# Patient Record
Sex: Male | Born: 1950 | Race: White | Hispanic: No | Marital: Married | Smoking: Never smoker
Health system: Southern US, Community
[De-identification: ages and names within clinical notes are randomized; demographics above are authoritative.]

## PROBLEM LIST (undated history)

## (undated) DIAGNOSIS — M1990 Unspecified osteoarthritis, unspecified site: Secondary | ICD-10-CM

## (undated) DIAGNOSIS — M75100 Unspecified rotator cuff tear or rupture of unspecified shoulder, not specified as traumatic: Secondary | ICD-10-CM

## (undated) DIAGNOSIS — C4490 Unspecified malignant neoplasm of skin, unspecified: Secondary | ICD-10-CM

## (undated) DIAGNOSIS — M6530 Trigger finger, unspecified finger: Secondary | ICD-10-CM

## (undated) DIAGNOSIS — E119 Type 2 diabetes mellitus without complications: Secondary | ICD-10-CM

## (undated) DIAGNOSIS — E785 Hyperlipidemia, unspecified: Secondary | ICD-10-CM

## (undated) DIAGNOSIS — N183 Chronic kidney disease, stage 3 unspecified: Secondary | ICD-10-CM

## (undated) DIAGNOSIS — R011 Cardiac murmur, unspecified: Secondary | ICD-10-CM

## (undated) DIAGNOSIS — E291 Testicular hypofunction: Secondary | ICD-10-CM

## (undated) DIAGNOSIS — D75839 Thrombocytosis, unspecified: Secondary | ICD-10-CM

## (undated) DIAGNOSIS — K2281 Esophageal polyp: Secondary | ICD-10-CM

## (undated) DIAGNOSIS — D582 Other hemoglobinopathies: Secondary | ICD-10-CM

## (undated) DIAGNOSIS — N400 Enlarged prostate without lower urinary tract symptoms: Secondary | ICD-10-CM

## (undated) DIAGNOSIS — G5600 Carpal tunnel syndrome, unspecified upper limb: Secondary | ICD-10-CM

## (undated) DIAGNOSIS — K219 Gastro-esophageal reflux disease without esophagitis: Secondary | ICD-10-CM

## (undated) DIAGNOSIS — I10 Essential (primary) hypertension: Secondary | ICD-10-CM

## (undated) DIAGNOSIS — E1121 Type 2 diabetes mellitus with diabetic nephropathy: Secondary | ICD-10-CM

## (undated) DIAGNOSIS — E1142 Type 2 diabetes mellitus with diabetic polyneuropathy: Secondary | ICD-10-CM

## (undated) DIAGNOSIS — E1165 Type 2 diabetes mellitus with hyperglycemia: Secondary | ICD-10-CM

## (undated) DIAGNOSIS — H35039 Hypertensive retinopathy, unspecified eye: Secondary | ICD-10-CM

## (undated) HISTORY — DX: Benign prostatic hyperplasia without lower urinary tract symptoms: N40.0

## (undated) HISTORY — PX: CARPAL BOSS EXCISION: SHX1302

## (undated) HISTORY — DX: Testicular hypofunction: E29.1

## (undated) HISTORY — DX: Thrombocytosis, unspecified: D75.839

## (undated) HISTORY — DX: Type 2 diabetes mellitus with hyperglycemia: E11.65

## (undated) HISTORY — DX: Unspecified rotator cuff tear or rupture of unspecified shoulder, not specified as traumatic: M75.100

## (undated) HISTORY — PX: CATARACT EXTRACTION W/ INTRAOCULAR LENS IMPLANT: SHX1309

## (undated) HISTORY — DX: Other hemoglobinopathies: D58.2

## (undated) HISTORY — DX: Carpal tunnel syndrome, unspecified upper limb: G56.00

## (undated) HISTORY — PX: CARPAL TUNNEL RELEASE: SHX101

## (undated) HISTORY — DX: Type 2 diabetes mellitus with diabetic nephropathy: E11.21

## (undated) HISTORY — DX: Chronic kidney disease, stage 3 unspecified: N18.30

## (undated) HISTORY — DX: Hypertensive retinopathy, unspecified eye: H35.039

## (undated) HISTORY — DX: Hyperlipidemia, unspecified: E78.5

## (undated) HISTORY — PX: KNEE SURGERY: SHX244

## (undated) HISTORY — DX: Esophageal polyp: K22.81

## (undated) HISTORY — DX: Cardiac murmur, unspecified: R01.1

## (undated) HISTORY — DX: Trigger finger, unspecified finger: M65.30

## (undated) HISTORY — DX: Type 2 diabetes mellitus with diabetic polyneuropathy: E11.42

## (undated) HISTORY — PX: ROTATOR CUFF REPAIR: SHX139

## (undated) HISTORY — PX: PARATHYROIDECTOMY: SHX19

---

## 1999-09-22 ENCOUNTER — Encounter: Admission: RE | Admit: 1999-09-22 | Discharge: 1999-12-21 | Payer: Self-pay | Admitting: *Deleted

## 2007-03-09 ENCOUNTER — Encounter: Admission: RE | Admit: 2007-03-09 | Discharge: 2007-03-09 | Payer: Self-pay | Admitting: Endocrinology

## 2007-04-03 ENCOUNTER — Encounter: Admission: RE | Admit: 2007-04-03 | Discharge: 2007-04-03 | Payer: Self-pay | Admitting: General Surgery

## 2007-04-21 ENCOUNTER — Encounter: Admission: RE | Admit: 2007-04-21 | Discharge: 2007-04-21 | Payer: Self-pay | Admitting: General Surgery

## 2007-05-26 ENCOUNTER — Ambulatory Visit (HOSPITAL_COMMUNITY): Admission: RE | Admit: 2007-05-26 | Discharge: 2007-05-27 | Payer: Self-pay | Admitting: General Surgery

## 2007-05-26 ENCOUNTER — Encounter (HOSPITAL_BASED_OUTPATIENT_CLINIC_OR_DEPARTMENT_OTHER): Payer: Self-pay | Admitting: General Surgery

## 2008-09-27 IMAGING — US US SOFT TISSUE HEAD/NECK
1 series · 13 of 25 positions shown · non-contrast
Comparison: [HOSPITAL] neck MRI 04/03/07.

CLINICAL DATA: Primary hyperparathyroidism.  Elevated calcium.  Question parathyroid tumor with previous GI 04/03/07 enhancing lesion posterior right thyroid gland which may represent parathyroid adenoma.  For further assessment. 
THYROID ULTRASOUND:
TECHNIQUE: Ultrasound examination of the thyroid gland and adjacent soft tissue structures was performed.

[Series 1: us soft tissue head/neck · 0.07mm/px · 13 of 51 slices shown]
[im 1/51]
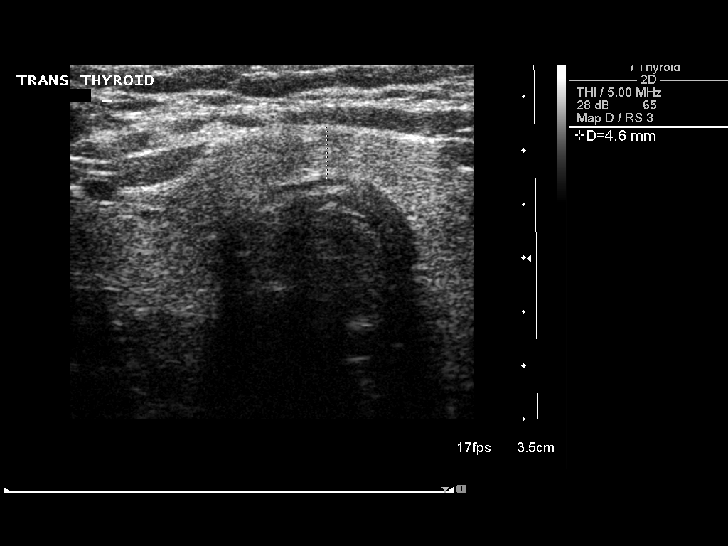
[im 5/51]
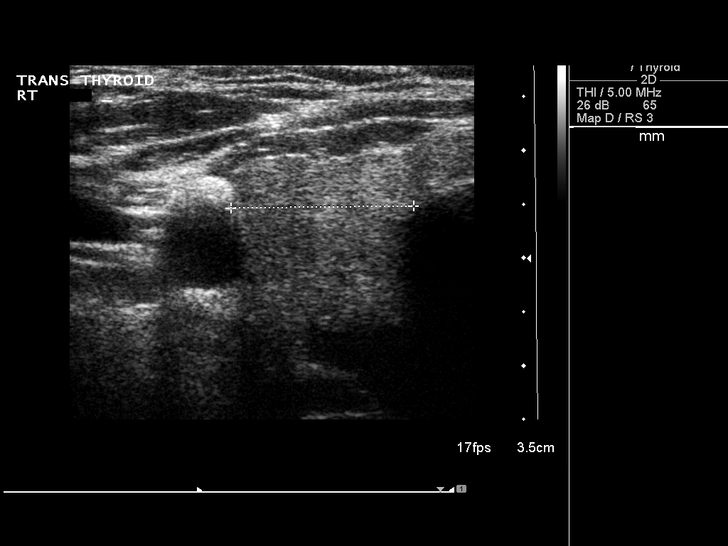
[im 9/51]
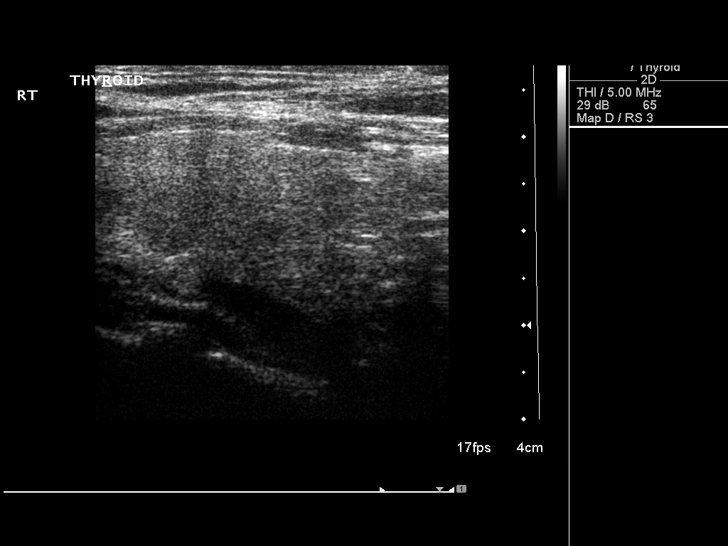
[im 13/51]
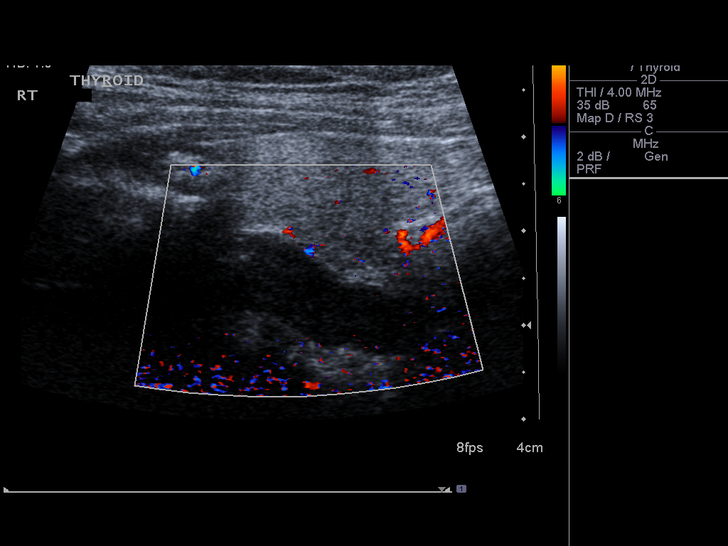
[im 17/51]
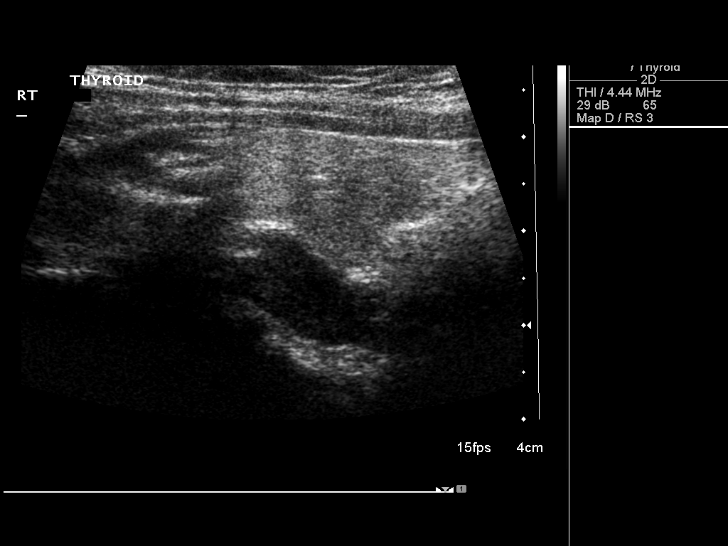
[im 21/51]
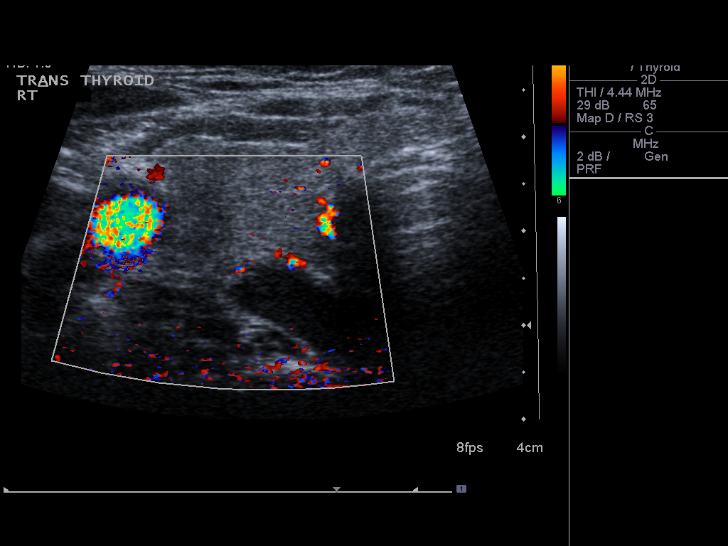
[im 26/51]
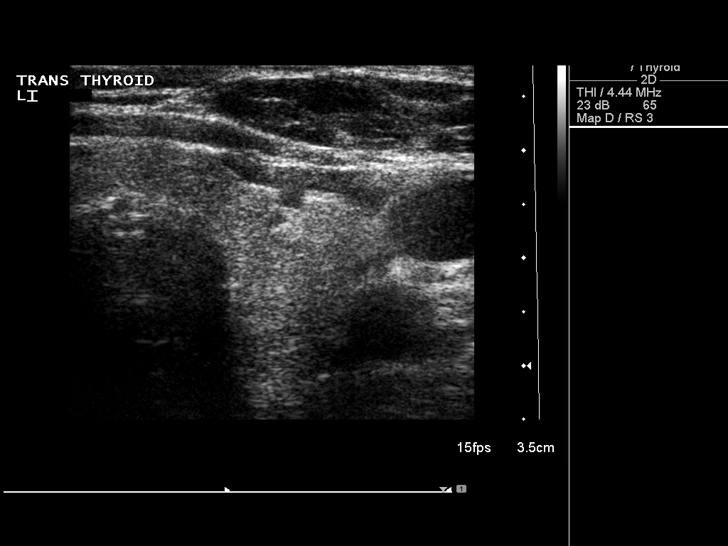
[im 30/51]
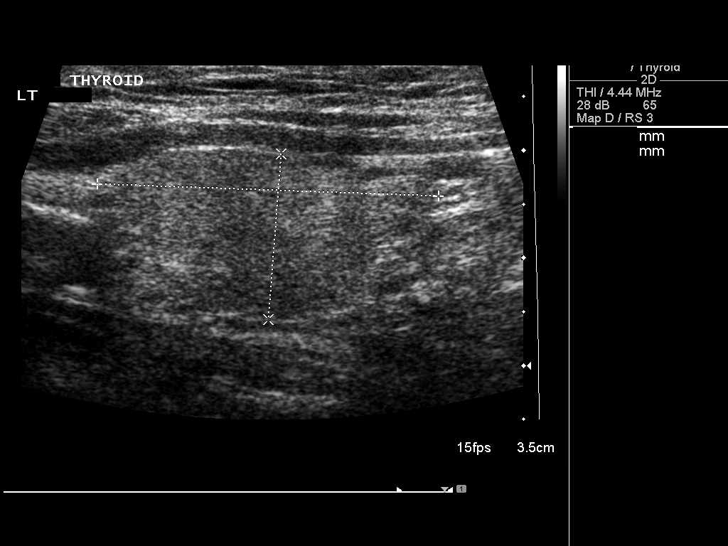
[im 34/51]
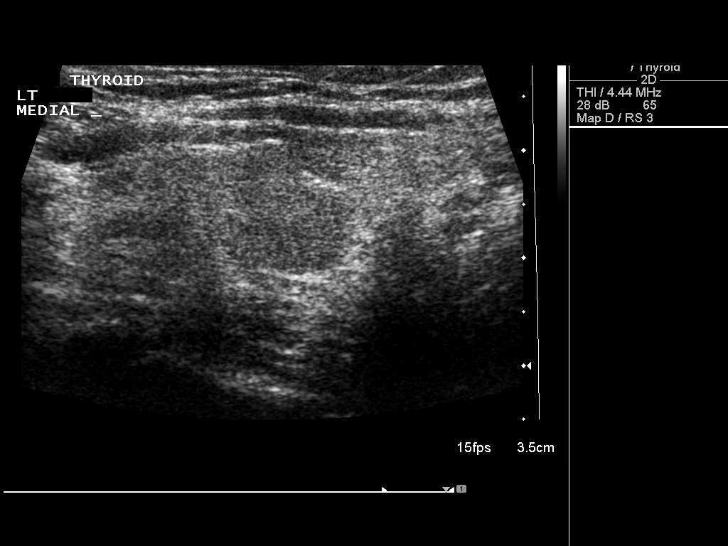
[im 38/51]
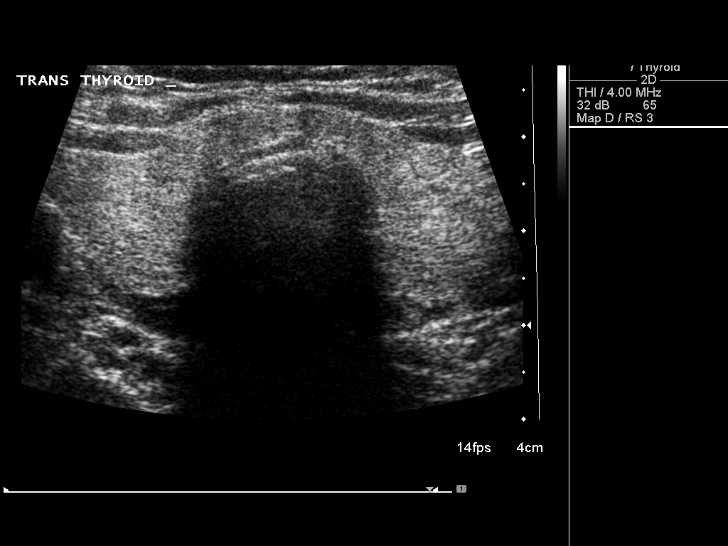
[im 42/51]
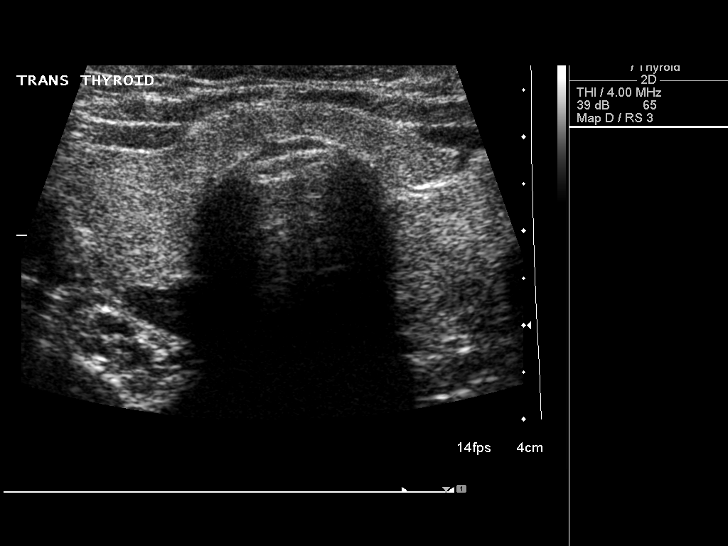
[im 46/51]
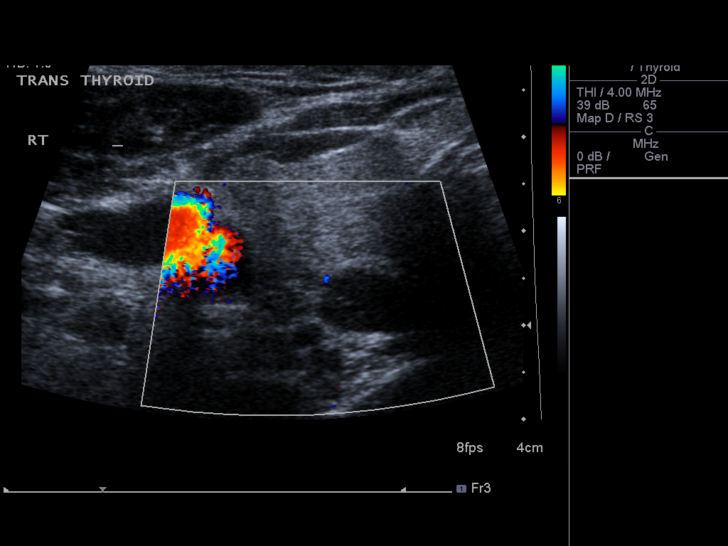
[im 51/51]
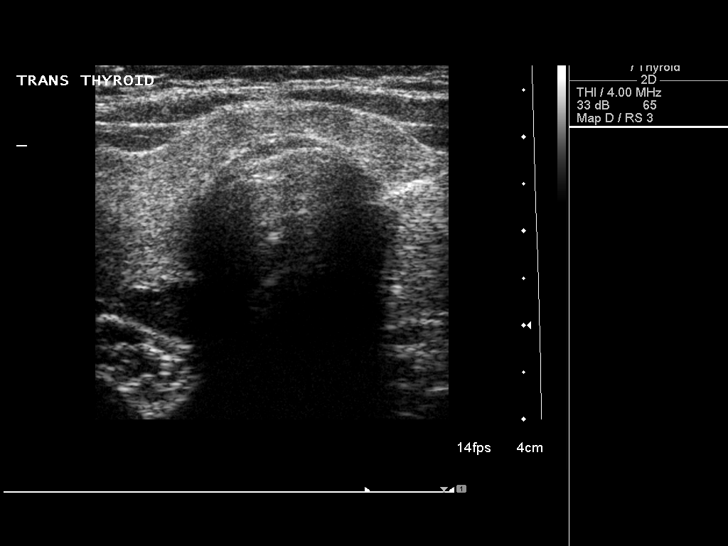

[13 of 25 positions shown; findings below may reference images not displayed]

FINDINGS: In region of MRI is marginated ovoid hypoechoic nodule at the posterior right lobe thyroid midportion.  This measures 2.3 cm long x 0.8 cm AP x 1.5 cm wide.  In light of clinical findings this likely represents parathyroid adenoma although nonspecific characteristics sonographically.  No other parathyroid gland nodule is identified by ultrasound.  The thyroid gland appears normal with the right lobe measuring 3.8 cm long x 1.8 cm AP x 1.7 cm wide and left lobe 3.5 cm long x 1.6 cm AP x 1.5 cm wide.  The isthmus measures upper limits of normal at 5 mm.  Thyroid echotexture is homogeneous.
IMPRESSION: 1.  Ultrasound confirms hypoechoic nodule posterior to midright lobe thyroid ? probable parathyroid adenoma in light of clinical history.  No other parathyroid nodules delineated by ultrasound.  
2.  Normal appearing thyroid. 
3.  No sonographic evidence for cervical adenopathy.

## 2010-08-05 NOTE — Op Note (Signed)
[name], [name]              ACCOUNT NO.:  [id number]   MEDICAL RECORD NO.:  [id number]          PATIENT TYPE:  OIB   LOCATION:  2550                         FACILITY:  [facility]   PHYSICIAN:  Leonie Man, M.D.   DATE OF BIRTH:  01-Jul-1950   DATE OF PROCEDURE:  05/26/2007  DATE OF DISCHARGE:                               OPERATIVE REPORT   PREOPERATIVE DIAGNOSIS:  Primary hyperparathyroidism.   POSTOPERATIVE DIAGNOSIS:  Primary hyperparathyroidism.   PROCEDURE:  Minimally invasive resection parathyroid with excision of  right inferior parathyroid gland.   SURGEON:  Leonie Man, M.D.   ASSISTANT:  Velora Heckler, M.D.   ANESTHESIA:  General.   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   ESTIMATED BLOOD LOSS:  Minimal.   COMPLICATIONS:  None.   INDICATIONS FOR PROCEDURE:  Man presenting with hypercalcemia.  He  underwent sestamibi scan which was nondiagnostic.  He underwent MRI of  the neck and ultrasound of the neck both of which identify a mass  posterior to the right lobe of the thyroid that may be consistent with a  parathyroid adenoma.  The patient had a moderately elevated PTH level of  83 picograms per mL and a 24-hour urinary calcium of 554.  The patient  comes to the operating room now after the risks and potential benefits  of surgery have been fully discussed.  All questions answered and  consent obtained.   DESCRIPTION OF PROCEDURE:  Following induction of satisfactory general  anesthesia, the neck is prepped and draped to be included in a sterile  operative field.  Positive identification of the patient, Ronald [name]  and the procedure as R minimally invasive parathyroid resection was  carried out.  We then made a transverse incision extending from the  right sternocleidomastoid anterior border to the midline of the trachea,  deepening this through skin and subcutaneous tissue raising a  myocutaneous flap inferiorly to the  clavicle and another up to the  thyroid cartilage.  The midline of the strap muscles are opened and  retracted to the right laterally.  The thyroid was dissected.  The  middle thyroid vein was taken between clips and the thyroid was  reflected medially.  Behind the right thyroid lobe a large parathyroid  adenoma was noted to be closely adhered to the posterior wall of the  thyroid.  This was dissected off carefully maintaining hemostasis  throughout the course of dissection and dissecting the parathyroid  completely off of the posterior wall of the thyroid.  This was removed  in its entirety and forwarded for pathologic evaluation.  Frozen section  diagnosis confirmed this as parathyroid tissue consistent with adenoma.  All areas of the dissection then checked for hemostasis.  Additional  bleeding points were treated electrocautery.  Sponge and instrument  counts were doubly verified.  I used Surgicel pledgets to pack into the  region of dissection for additional hemostasis.  The midline strap  muscles were then closed with interrupted 3-0 Vicryl sutures.  The  platysma muscle was also reapproximated with interrupted 3-0 Vicryl  sutures.  Skin closed  with 5-0 Monocryl and reinforced with Dermabond  and Steri-Strips.  The patient is then removed from the operating room  to the recovery room in stable condition.  He tolerated the procedure  well.      Leonie Man, M.D.  Electronically Signed     PB/MEDQ  D:  05/26/2007  T:  05/26/2007  Job:  [id number]   cc:   Leonie Man, M.D.  Dorisann Frames, M.D.  Robert A. Nicholos Johns, M.D.

## 2010-12-15 LAB — COMPREHENSIVE METABOLIC PANEL
AST: 31
Albumin: 4.2
Alkaline Phosphatase: 64
BUN: 11
GFR calc non Af Amer: >60
Glucose, Bld: 187 — ABNORMAL HIGH
Total Bilirubin: 1
Total Protein: 6.5

## 2010-12-15 LAB — CALCIUM
Calcium: 8.9
Calcium: 9.9

## 2010-12-15 LAB — PROTIME-INR: INR: 0.9

## 2010-12-15 LAB — CBC
MCHC: 34.1
MCV: 86.6
Platelets: 240

## 2010-12-15 LAB — DIFFERENTIAL
Eosinophils Absolute: 0.1
Lymphocytes Relative: 32
Monocytes Absolute: 0.9
Neutro Abs: 4.8
Neutrophils Relative %: 56

## 2013-02-15 ENCOUNTER — Encounter (HOSPITAL_COMMUNITY): Payer: Self-pay | Admitting: Emergency Medicine

## 2013-02-15 DIAGNOSIS — Y9301 Activity, walking, marching and hiking: Secondary | ICD-10-CM | POA: Insufficient documentation

## 2013-02-15 DIAGNOSIS — Y92009 Unspecified place in unspecified non-institutional (private) residence as the place of occurrence of the external cause: Secondary | ICD-10-CM | POA: Insufficient documentation

## 2013-02-15 DIAGNOSIS — W010XXA Fall on same level from slipping, tripping and stumbling without subsequent striking against object, initial encounter: Secondary | ICD-10-CM | POA: Insufficient documentation

## 2013-02-15 DIAGNOSIS — Z880 Allergy status to penicillin: Secondary | ICD-10-CM | POA: Insufficient documentation

## 2013-02-15 DIAGNOSIS — Z8719 Personal history of other diseases of the digestive system: Secondary | ICD-10-CM | POA: Insufficient documentation

## 2013-02-15 DIAGNOSIS — I10 Essential (primary) hypertension: Secondary | ICD-10-CM | POA: Insufficient documentation

## 2013-02-15 DIAGNOSIS — E119 Type 2 diabetes mellitus without complications: Secondary | ICD-10-CM | POA: Insufficient documentation

## 2013-02-15 DIAGNOSIS — Z9889 Other specified postprocedural states: Secondary | ICD-10-CM | POA: Insufficient documentation

## 2013-02-15 DIAGNOSIS — IMO0002 Reserved for concepts with insufficient information to code with codable children: Secondary | ICD-10-CM | POA: Insufficient documentation

## 2013-02-15 NOTE — ED Notes (Signed)
Pt. slipped on wet ramp at home and fell this evening , denies LOC / ambulatory , reports pain at left shoulder joint . Alert and oriented . Respirations unlabored .

## 2013-02-16 ENCOUNTER — Emergency Department (HOSPITAL_COMMUNITY): Payer: BC Managed Care – PPO

## 2013-02-16 ENCOUNTER — Emergency Department (HOSPITAL_COMMUNITY)
Admission: EM | Admit: 2013-02-16 | Discharge: 2013-02-16 | Disposition: A | Discharge status: Home / Self Care | Payer: BC Managed Care – PPO | Attending: Emergency Medicine | Admitting: Emergency Medicine

## 2013-02-16 DIAGNOSIS — S43402A Unspecified sprain of left shoulder joint, initial encounter: Secondary | ICD-10-CM

## 2013-02-16 HISTORY — DX: Type 2 diabetes mellitus without complications: E11.9

## 2013-02-16 HISTORY — DX: Essential (primary) hypertension: I10

## 2013-02-16 HISTORY — DX: Gastro-esophageal reflux disease without esophagitis: K21.9

## 2013-02-16 NOTE — ED Provider Notes (Signed)
Medical screening examination/treatment/procedure(s) were performed by non-physician practitioner and as supervising physician I was immediately available for consultation/collaboration.  Megan E Docherty, MD 02/16/13 2134 

## 2013-02-16 NOTE — ED Provider Notes (Signed)
CSN: [id number]     Arrival date & time 02/15/13  2311 History   First MD Initiated Contact with Patient 02/16/13 0102     Chief Complaint  Patient presents with  . Fall   HPI  History provided by the patient. Patient is a 62 year old male with history of hypertension and diabetes who presents with left shoulder pain injury after a fall. Patient states that he was walking down a ramp outside which was wet and costumes slipped falling backwards. He landed onto his left arm and shoulder area. Since that time he has had worsening shoulder pain. Pain is worse with any movements and some palpation. He did not use any medications for his symptoms or other treatments. He denies any weakness or numbness to the hand or arm. Denies any other injuries. No LOC. No neck or back pain. No other associated symptoms.    Past Medical History  Diagnosis Date  . Hypertension   . Diabetes mellitus without complication   . GERD (gastroesophageal reflux disease)    Past Surgical History  Procedure Laterality Date  . Rotator cuff repair    . Carpal boss excision    . Knee surgery     No family history on file. History  Substance Use Topics  . Smoking status: Never Smoker   . Smokeless tobacco: Not on file  . Alcohol Use: No    Review of Systems  Neurological: Negative for weakness and numbness.  All other systems reviewed and are negative.    Allergies  Penicillins  Home Medications  No current outpatient prescriptions on file. BP 119/67  Pulse 102  Temp(Src) 98.2 F (36.8 C) (Oral)  Resp 14  Ht 5\' 6"  (1.676 m)  Wt 175 lb (79.379 kg)  BMI 28.26 kg/m2  SpO2 96% Physical Exam  Nursing note and vitals reviewed. Constitutional: He is oriented to person, place, and time. He appears well-developed and well-nourished. No distress.  HENT:  Head: Normocephalic.  Neck: Normal range of motion. Neck supple.  No cervical midline tenderness.  Cardiovascular: Normal rate and regular rhythm.    Pulmonary/Chest: Effort normal and breath sounds normal. No respiratory distress. He has no wheezes. He has no rales.  Musculoskeletal:       Cervical back: Normal.       Thoracic back: Normal.       Lumbar back: Normal.  Reduced range of motion of left shoulder secondary to pain. This is greatest with abduction and forward flexion. There is tenderness over the anterior lateral portion of the shoulder. No deformities. No pain over the clavicle or a.c. joint. Normal distal pulses, good strength and sensation in hand.  Neurological: He is alert and oriented to person, place, and time.  Skin: Skin is warm.  Psychiatric: He has a normal mood and affect. His behavior is normal.    ED Course  Procedures    COORDINATION OF CARE:  Nursing notes reviewed. Vital signs reviewed. Initial pt interview and examination performed.   1:16 AM-patient seen and evaluated. Patient appears well no acute distress. Patient with reduced range of motion of left arm and shoulder area.  Discussed work up plan with pt at bedside, which includes shoulder x-rays. Pt agrees with plan.  X-rays reviewed. No signs of acute fracture injury. There is some arthritis present in the shoulder. Patient already has swelling from previous right shoulder injury and surgery. He has been instructed to continue to use the shoulder for comfort. Also instructed to use rest, ice,  compression and elevation. He agrees with the plan and will follow up with PCP or his orthopedic specialist.    Imaging Review Dg Shoulder Left  02/16/2013   CLINICAL DATA:  Fall.  Left shoulder injury and pain.  EXAM: LEFT SHOULDER - 2+ VIEW  COMPARISON:  None.  FINDINGS: There is no evidence of fracture or dislocation. Mild degenerative spurring is seen involving the glenohumeral joint. No other significant bone abnormality identified.  IMPRESSION: No acute findings.  Mild glenohumeral degenerative spurring.   Electronically Signed   By: Myles Rosenthal M.D.    On: 02/16/2013 00:57      MDM   1. Shoulder sprain, left, initial encounter        Angus Seller, PA-C 02/16/13 2052

## 2016-10-08 ENCOUNTER — Encounter (INDEPENDENT_AMBULATORY_CARE_PROVIDER_SITE_OTHER): Payer: Self-pay | Admitting: Orthopaedic Surgery

## 2016-10-08 ENCOUNTER — Ambulatory Visit (INDEPENDENT_AMBULATORY_CARE_PROVIDER_SITE_OTHER): Payer: BC Managed Care – PPO | Admitting: Orthopaedic Surgery

## 2016-10-08 ENCOUNTER — Ambulatory Visit (INDEPENDENT_AMBULATORY_CARE_PROVIDER_SITE_OTHER): Payer: Self-pay

## 2016-10-08 VITALS — BP 133/69 | HR 93 | Ht 65.0 in | Wt 170.0 lb

## 2016-10-08 DIAGNOSIS — M25512 Pain in left shoulder: Secondary | ICD-10-CM

## 2016-10-08 DIAGNOSIS — G8929 Other chronic pain: Secondary | ICD-10-CM | POA: Diagnosis not present

## 2016-10-08 DIAGNOSIS — M542 Cervicalgia: Secondary | ICD-10-CM | POA: Diagnosis not present

## 2016-10-08 NOTE — Progress Notes (Signed)
Office Visit Note   Patient: Ronald [name]           Date of Birth: 04/29/1950           MRN: [id number] Visit Date: 10/08/2016              Requested by: Elias Elseeade, Robert, MD [phone] [address] [place], [place] [address] PCP: Elias Elseeade, Robert, MD   Assessment & Plan: Visit Diagnoses:  1. Chronic left shoulder pain   2. Neck pain     Plan: He can use some heat, ice Tylenol. Aspercreme over his neck. If he has increased symptoms he'll let us know. On the resistive testing his rotator cuff repair still doing well. He'll try to modify some his work activities and try to pain a little extra with the right hand and less with the left to see if this helps settle down his shoulder symptoms. Is having persistent symptoms he can return.  Follow-Up Instructions: No Follow-up on file.   Orders:  Orders Placed This Encounter  Procedures  . XR Shoulder Left  . XR Cervical Spine 2 or 3 views   No orders of the defined types were placed in this encounter.     Procedures: No procedures performed   Clinical Data: No additional findings.   Subjective: Chief Complaint  Patient presents with  . Left Shoulder - Pain    HPI patient seen with some increased left shoulder pain he has some pain when he rotates his neck also with outstretched overhead activities. Previous rotator cuff repair on the left several years ago. 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 They've been recently very busy at work and having to do more pain. He denies fever or chills no erythema of the old the incision for rotator cuff repair.  Review of Systems 14 point review of systems is updated from last office visit and is unchanged other than as mentioned in history of present illness   Objective: Vital Signs: BP 133/69   Pulse 93   Ht 5\' 5"  (1.651 m)   Wt 170 lb (77.1 kg)   BMI 28.29 kg/m   Physical Exam  Constitutional: He is  oriented to person, place, and time. He appears well-developed and well-nourished.  HENT:  Head: Normocephalic and atraumatic.  Eyes: Pupils are equal, round, and reactive to light. EOM are normal.  Neck: No tracheal deviation present. No thyromegaly present.  Cardiovascular: Normal rate.   Pulmonary/Chest: Effort normal. He has no wheezes.  Abdominal: Soft. Bowel sounds are normal.  Musculoskeletal:  Patient has a brachioplexus tenderness on the left negative on the right well-healed incision from his rotator cuff repair. Negative drop arm test on the left. No supraspinatus weakness. Some crepitus with shoulder range of motion.  Neurological: He is alert and oriented to person, place, and time.  Skin: Skin is warm and dry. Capillary refill takes less than 2 seconds.  Psychiatric: He has a normal mood and affect. His behavior is normal. Judgment and thought content normal.    Ortho Exam  Specialty Comments:  No specialty comments available.  Imaging: No results found.   PMFS History: Patient Active Problem List   Diagnosis Date Noted  . Chronic left shoulder pain 10/08/2016  . Neck pain 10/08/2016   Past Medical History:  Diagnosis Date  . Diabetes mellitus without complication (HCC)   . GERD (gastroesophageal reflux disease)   . Hypertension  No family history on file.  Past Surgical History:  Procedure Laterality Date  . CARPAL BOSS EXCISION    . KNEE SURGERY    . ROTATOR CUFF REPAIR     Social History   Occupational History  . Not on file.   Social History Main Topics  . Smoking status: Never Smoker  . Smokeless tobacco: Never Used  . Alcohol use No  . Drug use: No  . Sexual activity: Not on file

## 2016-12-09 ENCOUNTER — Telehealth (INDEPENDENT_AMBULATORY_CARE_PROVIDER_SITE_OTHER): Payer: Self-pay | Admitting: Orthopaedic Surgery

## 2016-12-09 DIAGNOSIS — M542 Cervicalgia: Secondary | ICD-10-CM

## 2016-12-09 NOTE — Telephone Encounter (Signed)
Proceed with cervical MRI ROV after scan thanks

## 2016-12-09 NOTE — Addendum Note (Signed)
Addended by: Rogers Seeds on: 12/09/2016 07:19 PM   Modules accepted: Orders

## 2016-12-09 NOTE — Telephone Encounter (Signed)
I called and spoke with patient. He is continuing to have pain in the left side of his neck, in his left shoulder, and in his left shoulder blade. He thinks he may have a pinched nerve and states you said a MRI would be the only way to tell that.  Please advise on whether you would like for me to enter MRI order.  Neg for all MRI questions, prefers [facility].  I did explain to patient that Dr. Ophelia Charter is in surgery this afternoon and it may be tomorrow before I can call him back.

## 2016-12-09 NOTE — Telephone Encounter (Signed)
Patient called asking if he could get a MRI possibly since he's still currently in a lot of pain. Either way the patient wanted to speak with you if you could give him a call at [phone]

## 2016-12-09 NOTE — Telephone Encounter (Signed)
Order entered. I left voicemail for patient advising and asked for return call to schedule ROV after scan.

## 2016-12-14 ENCOUNTER — Telehealth (INDEPENDENT_AMBULATORY_CARE_PROVIDER_SITE_OTHER): Payer: Self-pay | Admitting: Orthopaedic Surgery

## 2016-12-14 NOTE — Telephone Encounter (Signed)
Message sent in error

## 2017-01-01 ENCOUNTER — Ambulatory Visit
Admission: RE | Admit: 2017-01-01 | Discharge: 2017-01-01 | Disposition: A | Discharge status: Home / Self Care | Payer: BC Managed Care – PPO | Source: Ambulatory Visit | Attending: Orthopaedic Surgery | Admitting: Orthopaedic Surgery

## 2017-01-01 DIAGNOSIS — M542 Cervicalgia: Secondary | ICD-10-CM

## 2017-01-08 ENCOUNTER — Ambulatory Visit (INDEPENDENT_AMBULATORY_CARE_PROVIDER_SITE_OTHER): Payer: BC Managed Care – PPO | Admitting: Orthopaedic Surgery

## 2017-01-08 ENCOUNTER — Encounter (INDEPENDENT_AMBULATORY_CARE_PROVIDER_SITE_OTHER): Payer: Self-pay | Admitting: Orthopaedic Surgery

## 2017-01-08 VITALS — BP 127/73 | HR 91 | Ht 65.0 in | Wt 175.0 lb

## 2017-01-08 DIAGNOSIS — M47812 Spondylosis without myelopathy or radiculopathy, cervical region: Secondary | ICD-10-CM

## 2017-01-08 NOTE — Progress Notes (Signed)
Office Visit Note   Patient: Ronald [name]           Date of Birth: 05/28/1950           MRN: [id number] Visit Date: 01/08/2017              Requested by: Elias Elseeade, Robert, MD [phone] [address] [place] , [place] [address] PCP: Elias Elseeade, Robert, MD   Assessment & Plan: Visit Diagnoses:  1. Cervical facet joint syndrome     Plan: Review the MRI [facility] copy report he primarily has C5-6 facet degeneration consistent with symptoms. We'll place monitor prednisone 5000 on effective we can now proceed with cervical facet injection with Dr. Alvester MorinNewton. Pathophysiology discussed questions was answered. Recheck in one month. He is continuing to work.  Follow-Up Instructions: Return in about 1 month (around 02/08/2017).   Orders:  No orders of the defined types were placed in this encounter.  No orders of the defined types were placed in this encounter.     Procedures: No procedures performed   Clinical Data: No additional findings.   Subjective: Chief Complaint  Patient presents with  . Neck - Follow-up    HPI [age] year old male returns her persistent problems with cervical pain that radiates into both shoulders. Said previous rotator cuff surgery on his last several years ago which is been doing well. 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 His pain with rotation of his neck sometimes pain with extension more than flexion. Pain radiates down his arms into his hands.. Patient denies gait disturbance no fever or chills. No history of falling.   Review of Systems 14 point review of systems updated from 10/08/2016 and is unchanged from his previous visit other than as mentioned in history of present illness.   Objective: Vital Signs: BP 127/73   Pulse 91   Ht 5\' 5"  (1.651 m)   Wt 175 lb (79.4 kg)   BMI 29.12 kg/m   Physical Exam  Constitutional: He is oriented to person, place, and time. He appears well-developed and well-nourished.  HENT:    Head: Normocephalic and atraumatic.  Eyes: Pupils are equal, round, and reactive to light. EOM are normal.  Neck: No tracheal deviation present. No thyromegaly present.  Cardiovascular: Normal rate.   Pulmonary/Chest: Effort normal. He has no wheezes.  Abdominal: Soft. Bowel sounds are normal.  Neurological: He is alert and oriented to person, place, and time.  Skin: Skin is warm and dry. Capillary refill takes less than 2 seconds.  Psychiatric: He has a normal mood and affect. His behavior is normal. Judgment and thought content normal.    Ortho Exam patient has brachial plexus tenderness on the left negative on the right. Rotator cuff incision is well-healed negative drop arm test. I personally reflexes are 2+ and symmetrical. Normal capillary refill. No evidence of peripheral nerve entrapment median nerve wrist ulnar nerve at the elbow.  Specialty Comments:  No specialty comments available.  Imaging: Study Result   CLINICAL DATA:  Neck pain radiating into the left shoulder for 4 months. No known injury.  EXAM: MRI CERVICAL SPINE WITHOUT CONTRAST  TECHNIQUE: Multiplanar, multisequence MR imaging of the cervical spine was performed. No intravenous contrast was administered.  COMPARISON:  None.  FINDINGS: Alignment: Trace anterolisthesis C5 on C6.  Vertebrae: No fracture worrisome lesion.  Cord: Normal signal throughout.  Posterior Fossa, vertebral arteries, paraspinal tissues: Negative.  Disc levels:  C2-3: Mild facet degenerative  change on the left. Otherwise negative.  C3-4:  Negative.  C4-5:  Negative.  C5-6: The patient has left much worse than right facet arthropathy with marrow edema in the left facets. Shallow broad-based right paracentral protrusion is seen but the central canal and foramina are open.  C6-7:  Negative.  C7-T1:  Negative.  IMPRESSION: Dominant finding is facet arthropathy on the left at C5-6 where there is marrow  edema about the facet joint. Mild degenerative disc disease without central canal or foraminal stenosis is identified as described above.   Electronically Signed   By: Drusilla Kanner M.D.   On: 01/01/2017 18:25      PMFS History: Patient Active Problem List   Diagnosis Date Noted  . Chronic left shoulder pain 10/08/2016  . Neck pain 10/08/2016   Past Medical History:  Diagnosis Date  . Diabetes mellitus without complication (HCC)   . GERD (gastroesophageal reflux disease)   . Hypertension     No family history on file.  Past Surgical History:  Procedure Laterality Date  . CARPAL BOSS EXCISION    . KNEE SURGERY    . ROTATOR CUFF REPAIR     Social History   Occupational History  . Not on file.   Social History Main Topics  . Smoking status: Never Smoker  . Smokeless tobacco: Never Used  . Alcohol use No  . Drug use: No  . Sexual activity: Not on file

## 2017-02-08 ENCOUNTER — Telehealth (INDEPENDENT_AMBULATORY_CARE_PROVIDER_SITE_OTHER): Payer: Self-pay | Admitting: Orthopaedic Surgery

## 2017-02-08 DIAGNOSIS — M47812 Spondylosis without myelopathy or radiculopathy, cervical region: Secondary | ICD-10-CM

## 2017-02-08 NOTE — Telephone Encounter (Signed)
Patient spouse called and wanted Tamela OddiBetsy to call her back in reagrd to getting injection-asked if needed appt spouse stated no Dr Ophelia CharterYates said give him a call if patient was not better.  [phone]

## 2017-02-10 NOTE — Telephone Encounter (Signed)
Patient wanted to proceed with cervical facet injection as he got no relief from Prednisone. Order entered.

## 2017-02-16 ENCOUNTER — Telehealth (INDEPENDENT_AMBULATORY_CARE_PROVIDER_SITE_OTHER): Payer: Self-pay | Admitting: Orthopaedic Surgery

## 2017-02-16 NOTE — Telephone Encounter (Signed)
Patient's wife Stanton Kidney(Debra) called asked for a call back concerning scheduling a appointment with Dr Alvester MorinNewton. The number to contact Stanton KidneyDebra is [phone]

## 2017-03-02 ENCOUNTER — Ambulatory Visit (INDEPENDENT_AMBULATORY_CARE_PROVIDER_SITE_OTHER): Payer: BC Managed Care – PPO

## 2017-03-02 ENCOUNTER — Encounter (INDEPENDENT_AMBULATORY_CARE_PROVIDER_SITE_OTHER): Payer: Self-pay | Admitting: Physical Medicine and Rehabilitation

## 2017-03-02 ENCOUNTER — Ambulatory Visit (INDEPENDENT_AMBULATORY_CARE_PROVIDER_SITE_OTHER): Payer: BC Managed Care – PPO | Admitting: Physical Medicine and Rehabilitation

## 2017-03-02 VITALS — BP 116/64 | HR 97 | Temp 98.5°F

## 2017-03-02 DIAGNOSIS — M47812 Spondylosis without myelopathy or radiculopathy, cervical region: Secondary | ICD-10-CM

## 2017-03-02 DIAGNOSIS — M542 Cervicalgia: Secondary | ICD-10-CM

## 2017-03-02 MED ORDER — METHYLPREDNISOLONE ACETATE 80 MG/ML IJ SUSP
80.0000 mg | Freq: Once | INTRAMUSCULAR | Status: AC
  Administered 2017-03-02: 80 mg

## 2017-03-02 MED ORDER — LIDOCAINE HCL (PF) 1 % IJ SOLN
2.0000 mL | Freq: Once | INTRAMUSCULAR | Status: AC
  Administered 2017-03-02: 2 mL

## 2017-03-02 NOTE — Patient Instructions (Signed)

## 2017-03-02 NOTE — Progress Notes (Deleted)
Pt states pain in neck that radiates to left shoulder blade. +Driver, -Dye Allergies, -BT's.

## 2017-03-03 NOTE — Procedures (Signed)
[name] is a 66 year old right-hand-dominant gentleman followed by Dr. Ophelia CharterYates with worsening chronic left-sided neck pain which refers down into the upper shoulder and shoulder blade.  He is failed conservative care and Dr. Ophelia CharterYates is felt like this is a cervical spine problem.  MRI was reviewed and does show significant facet arthritis on the left at C5-6 which could give the same referral pattern.  We are going to complete a diagnostic and hopefully therapeutic left C5-6 facet joint block.  The injection  will be diagnostic and hopefully therapeutic. The patient has failed conservative care including time, medications and activity modification.  Cervical Facet Joint Intra-Articular Injection with Fluoroscopic Guidance  Patient: Ronald [name]      Date of Birth: 06/29/1950 MRN: [id number] PCP: Elias Elseeade, Robert, MD      Visit Date: 03/02/2017   Universal Protocol:    Date/Time: 12/12/186:04 AM  Consent Given By: the patient  Position: PRONE  Additional Comments: Vital signs were monitored before and after the procedure. Patient was prepped and draped in the usual sterile fashion. The correct patient, procedure, and site was verified.   Injection Procedure Details:  Procedure Site One Meds Administered:  Meds ordered this encounter  Medications  . lidocaine (PF) (XYLOCAINE) 1 % injection 2 mL  . methylPREDNISolone acetate (DEPO-MEDROL) injection 80 mg     Laterality: Left  Location/Site:  C5-6  Needle size: 25 G  Needle type: Spinal  Needle Placement: Articular  Findings:  -Contrast Used: 0.5 mL iohexol 180 mg iodine/mL   -Comments: Excellent flow of contrast producing a partial arthrogram.  Procedure Details: The region overlying the facet joints mentioned above were localized under fluoroscopic visualization. The needle was inserted down to the level of the lateral mass of the superior articular process of the facet joint to be injected. Then, the needle was "walked  off" inferiorly into the lateral aspect of the facet joint. Bi-planar images were used for confirming placement and spot radiographs were documented.  A 0.25 ml volume of Omnipaque-240 was injected into the facet joint and a standard partial arthrogram was obtained. Radiographs were obtained of the arthrogram. A 0.5 ml. volume of the steroid/anesthetic solution was injected into the joint. This procedure was repeated for each facet joint injected.   Additional Comments:  The patient tolerated the procedure well No complications occurred Dressing: Band-Aid    Post-procedure details: Patient was observed during the procedure. Post-procedure instructions were reviewed.  Patient left the clinic in stable condition.

## 2017-03-29 ENCOUNTER — Telehealth (INDEPENDENT_AMBULATORY_CARE_PROVIDER_SITE_OTHER): Payer: Self-pay | Admitting: Orthopaedic Surgery

## 2017-03-29 NOTE — Telephone Encounter (Signed)
Patient doing much better since ESI with Dr. Alvester MorinNewton. He just wanted to let Dr. Ophelia CharterYates know instead of coming in tomorrow and paying another $94 copay. If you need to call patient his # [phone]

## 2017-03-30 ENCOUNTER — Ambulatory Visit (INDEPENDENT_AMBULATORY_CARE_PROVIDER_SITE_OTHER): Payer: BC Managed Care – PPO | Admitting: Orthopaedic Surgery

## 2017-03-30 NOTE — Telephone Encounter (Signed)
I left voicemail for patient advising. 

## 2017-03-30 NOTE — Telephone Encounter (Signed)
fyi

## 2017-03-30 NOTE — Telephone Encounter (Signed)
Tell him glad he is doing well. Thanks for calling.

## 2017-06-10 ENCOUNTER — Ambulatory Visit (INDEPENDENT_AMBULATORY_CARE_PROVIDER_SITE_OTHER): Payer: BC Managed Care – PPO | Admitting: Orthopaedic Surgery

## 2017-06-10 ENCOUNTER — Encounter (INDEPENDENT_AMBULATORY_CARE_PROVIDER_SITE_OTHER): Payer: Self-pay | Admitting: Orthopaedic Surgery

## 2017-06-10 VITALS — BP 125/74 | HR 99

## 2017-06-10 DIAGNOSIS — M65312 Trigger thumb, left thumb: Secondary | ICD-10-CM | POA: Insufficient documentation

## 2017-06-10 MED ORDER — LIDOCAINE HCL 1 % IJ SOLN
0.3000 mL | INTRAMUSCULAR | Status: AC | PRN
Start: 2017-06-10 — End: 2017-06-10
  Administered 2017-06-10: .3 mL

## 2017-06-10 MED ORDER — METHYLPREDNISOLONE ACETATE 40 MG/ML IJ SUSP
13.3300 mg | INTRAMUSCULAR | Status: AC | PRN
  Administered 2017-06-10: 13.33 mg

## 2017-06-10 MED ORDER — BUPIVACAINE HCL 0.25 % IJ SOLN
0.3300 mL | INTRAMUSCULAR | Status: AC | PRN
  Administered 2017-06-10: .33 mL

## 2017-06-10 NOTE — Progress Notes (Signed)
   Office Visit Note   Patient: Ronald [name]           Date of Birth: 11/20/1950           MRN: [id number] Visit Date: 06/10/2017              Requested by: Elias Elseeade, Robert, MD [phone] [address] [place], [place] [id number] PCP: Elias Elseeade, Robert, MD   Assessment & Plan: Visit Diagnoses:  1. Trigger thumb, left thumb     Plan: Left trigger thumb injection performed.  Dorsal splint applied he can use it short-term.  Follow-up if he has persistent symptoms.  Follow-Up Instructions: Return if symptoms worsen or fail to improve.   Orders:  No orders of the defined types were placed in this encounter.  No orders of the defined types were placed in this encounter.     Procedures: Hand/UE Inj: L thumb A1 for trigger finger on 06/10/2017 3:02 PM Details: 25 G needle Medications: 0.3 mL lidocaine 1 %; 0.33 mL bupivacaine 0.25 %; 13.33 mg methylPREDNISolone acetate 40 MG/ML      Clinical Data: No additional findings.   Subjective: Chief Complaint  Patient presents with  . Left Thumb - Pain    HPI 206 67-year-old male seen with left trigger thumb is been locking at night treatment opposite right ring finger injection with good relief.  Is directly over the A1 pulley worries painful.  Several times he is got up with a very painful thumb duct and locked position.  Review of Systems  14 pt ROS systems updated unchanged from last office note as it pertains to HPI.   Objective: Vital Signs: BP 125/74   Pulse 99   Physical Exam  Constitutional: He is oriented to person, place, and time. He appears well-developed and well-nourished.  HENT:  Head: Normocephalic and atraumatic.  Eyes: Pupils are equal, round, and reactive to light. EOM are normal.  Neck: No tracheal deviation present. No thyromegaly present.  Cardiovascular: Normal rate.  Pulmonary/Chest: Effort normal. He has no wheezes.  Abdominal: Soft. Bowel sounds are normal.  Neurological: He is alert and oriented  to person, place, and time.  Skin: Skin is warm and dry. Capillary refill takes less than 2 seconds.  Psychiatric: He has a normal mood and affect. His behavior is normal. Judgment and thought content normal.    Ortho Exam acting left thumb with palpable nodule tenderness at the A1 pulley.  Sensation of the thumb is intact normal extensor function.  Specialty Comments:  No specialty comments available.  Imaging: No results found.   PMFS History: Patient Active Problem List   Diagnosis Date Noted  . Chronic left shoulder pain 10/08/2016  . Neck pain 10/08/2016   Past Medical History:  Diagnosis Date  . Diabetes mellitus without complication (HCC)   . GERD (gastroesophageal reflux disease)   . Hypertension     No family history on file.  Past Surgical History:  Procedure Laterality Date  . CARPAL BOSS EXCISION    . KNEE SURGERY    . ROTATOR CUFF REPAIR     Social History   Occupational History  . Not on file  Tobacco Use  . Smoking status: Never Smoker  . Smokeless tobacco: Never Used  Substance and Sexual Activity  . Alcohol use: No  . Drug use: No  . Sexual activity: Not on file

## 2017-10-08 ENCOUNTER — Other Ambulatory Visit: Payer: Self-pay | Admitting: Family Medicine

## 2017-10-08 DIAGNOSIS — Z136 Encounter for screening for cardiovascular disorders: Secondary | ICD-10-CM

## 2017-10-19 ENCOUNTER — Ambulatory Visit
Admission: RE | Admit: 2017-10-19 | Discharge: 2017-10-19 | Disposition: A | Discharge status: Home / Self Care | Payer: BC Managed Care – PPO | Source: Ambulatory Visit | Attending: Family Medicine | Admitting: Family Medicine

## 2017-10-19 DIAGNOSIS — Z136 Encounter for screening for cardiovascular disorders: Secondary | ICD-10-CM

## 2019-02-15 ENCOUNTER — Ambulatory Visit: Payer: Self-pay

## 2019-02-15 ENCOUNTER — Encounter: Payer: Self-pay | Admitting: Orthopaedic Surgery

## 2019-02-15 ENCOUNTER — Ambulatory Visit: Payer: BC Managed Care – PPO | Admitting: Orthopaedic Surgery

## 2019-02-15 ENCOUNTER — Other Ambulatory Visit: Payer: Self-pay

## 2019-02-15 VITALS — BP 121/69 | HR 92 | Ht 65.0 in | Wt 175.0 lb

## 2019-02-15 DIAGNOSIS — M5442 Lumbago with sciatica, left side: Secondary | ICD-10-CM

## 2019-02-15 DIAGNOSIS — M79642 Pain in left hand: Secondary | ICD-10-CM

## 2019-02-15 DIAGNOSIS — M7671 Peroneal tendinitis, right leg: Secondary | ICD-10-CM | POA: Diagnosis not present

## 2019-02-15 MED ORDER — MELOXICAM 7.5 MG PO TABS: 7.5000 mg | ORAL_TABLET | Freq: Every day | ORAL | 2 refills | Status: DC

## 2019-02-15 NOTE — Progress Notes (Signed)
Office Visit Note   Patient: Ronald [name]           Date of Birth: 1950-04-20           MRN: [id number] Visit Date: 02/15/2019              Requested by: Maury Dus, MD [address] [place],  [place] [address] PCP: Maury Dus, MD   Assessment & Plan: Visit Diagnoses:  1. Pain of left hand   2. Acute bilateral low back pain with left-sided sciatica   3. Peroneal tendinitis, right leg     Plan: Patient applied dorsal splint to his long finger immobilizing the DIP joint see if this improves his symptoms.  He can take meloxicam as it does not raise his blood pressure for short course.  Swede-O application to right ankle for his peroneal tendinitis if this gets worse he can return and we can consider diagnostic MRI scan.  No evidence of radiculopathy on exam for his lumbar spine and x-rays results were reviewed.  We will check him in 5 weeks and  Follow-Up Instructions: Return in about 5 weeks (around 03/22/2019).   Orders:  Orders Placed This Encounter  Procedures  . XR Hand Complete Left  . XR Lumbar Spine 2-3 Views   Meds ordered this encounter  Medications  . meloxicam (MOBIC) 7.5 MG tablet    Sig: Take 1 tablet (7.5 mg total) by mouth daily.    Dispense:  30 tablet    Refill:  2      Procedures: No procedures performed   Clinical Data: No additional findings.   Subjective: Chief Complaint  Patient presents with  . Left Hand - Pain  . Lower Back - Pain    HPI 68 year old male here with left hand pain for about 6 months.  He states he has difficulty sometimes making a fist pain over the third MCP joint.  He has noticed some catching but no definite locking of his long finger.  He is also had low back pain is been using a back brace states he has sharp pain from his back that radiates into both sides mostly worse on the left and right into his left thigh and stops most of the time at his knee.  Denies numbness but states it has some burning  type pain.  He is used hydrocodone wakes him up at night he states Tylenol is not really helping.  Patient is also had lateral ankle pain no history of ankle trauma.  He states is worse when he is on his feet a lot.  He has noticed some swelling over the peroneal tendon sheath.  Review of Systems previous problems with left trigger thumb.  Positive for cervical spondylosis, low back pain.  Positive for diabetes on oral medication.  Positive hypertension controlled.   Objective: Vital Signs: BP 121/69   Pulse 92   Ht 5\' 5"  (1.651 m)   Wt 175 lb (79.4 kg)   BMI 29.12 kg/m   Physical Exam Constitutional:      Appearance: He is well-developed.  HENT:     Head: Normocephalic and atraumatic.  Eyes:     Pupils: Pupils are equal, round, and reactive to light.  Neck:     Thyroid: No thyromegaly.     Trachea: No tracheal deviation.  Cardiovascular:     Rate and Rhythm: Normal rate.  Pulmonary:     Effort: Pulmonary effort is normal.  Breath sounds: No wheezing.  Abdominal:     General: Bowel sounds are normal.     Palpations: Abdomen is soft.  Skin:    General: Skin is warm and dry.     Capillary Refill: Capillary refill takes less than 2 seconds.  Neurological:     Mental Status: He is alert and oriented to person, place, and time.  Psychiatric:        Behavior: Behavior normal.        Thought Content: Thought content normal.        Judgment: Judgment normal.     Ortho Exam left thumb is full range of motion no triggering.  Left long finger has tenderness over the A1 pulley.  Mild swelling noted over the MCP joint without extensor subluxation.  Collateral ligaments are stable PIP joint two-point sensation is intact carpal tunnel exam is negative.  Good cervical range of motion minimal brachial plexus tenderness negative Spurling.  Upper extremity reflexes are 2+.  Negative straight leg raising.  Patient can heel and toe walk.  He has tenderness over the peroneal tendons laterally  on the right ankle slight swelling but has good resistive strength.  Sensation of the foot is intact.   Specialty Comments:  No specialty comments available.  Imaging: No results found.   PMFS History: Patient Active Problem List   Diagnosis Date Noted  . Peroneal tendinitis, right leg 02/19/2019  . Trigger thumb, left thumb 06/10/2017  . Chronic left shoulder pain 10/08/2016  . Neck pain 10/08/2016   Past Medical History:  Diagnosis Date  . Diabetes mellitus without complication (HCC)   . GERD (gastroesophageal reflux disease)   . Hypertension     History reviewed. No pertinent family history.  Past Surgical History:  Procedure Laterality Date  . CARPAL BOSS EXCISION    . KNEE SURGERY    . ROTATOR CUFF REPAIR     Social History   Occupational History  . Not on file  Tobacco Use  . Smoking status: Never Smoker  . Smokeless tobacco: Never Used  Substance and Sexual Activity  . Alcohol use: No  . Drug use: No  . Sexual activity: Not on file

## 2019-02-19 DIAGNOSIS — M7671 Peroneal tendinitis, right leg: Secondary | ICD-10-CM | POA: Insufficient documentation

## 2019-03-22 ENCOUNTER — Ambulatory Visit: Payer: BC Managed Care – PPO | Admitting: Orthopaedic Surgery

## 2019-03-28 ENCOUNTER — Ambulatory Visit (INDEPENDENT_AMBULATORY_CARE_PROVIDER_SITE_OTHER): Payer: BC Managed Care – PPO | Admitting: Orthopaedic Surgery

## 2019-03-28 ENCOUNTER — Other Ambulatory Visit: Payer: Self-pay

## 2019-03-28 ENCOUNTER — Encounter: Payer: Self-pay | Admitting: Orthopaedic Surgery

## 2019-03-28 ENCOUNTER — Ambulatory Visit (INDEPENDENT_AMBULATORY_CARE_PROVIDER_SITE_OTHER): Payer: BC Managed Care – PPO

## 2019-03-28 VITALS — Ht 65.0 in | Wt 175.0 lb

## 2019-03-28 DIAGNOSIS — M25511 Pain in right shoulder: Secondary | ICD-10-CM | POA: Diagnosis not present

## 2019-03-28 DIAGNOSIS — M5136 Other intervertebral disc degeneration, lumbar region: Secondary | ICD-10-CM | POA: Insufficient documentation

## 2019-03-28 DIAGNOSIS — S6000XA Contusion of unspecified finger without damage to nail, initial encounter: Secondary | ICD-10-CM | POA: Insufficient documentation

## 2019-03-28 DIAGNOSIS — S60021A Contusion of right index finger without damage to nail, initial encounter: Secondary | ICD-10-CM

## 2019-03-28 DIAGNOSIS — M7671 Peroneal tendinitis, right leg: Secondary | ICD-10-CM

## 2019-03-28 DIAGNOSIS — M65332 Trigger finger, left middle finger: Secondary | ICD-10-CM

## 2019-03-28 DIAGNOSIS — M51369 Other intervertebral disc degeneration, lumbar region without mention of lumbar back pain or lower extremity pain: Secondary | ICD-10-CM

## 2019-03-28 NOTE — Progress Notes (Signed)
Office Visit Note   Patient: Ronald [name]           Date of Birth: 12-04-1950           MRN: [id number] Visit Date: 03/28/2019              Requested by: Maury Dus, MD [address] [place],  [place] [address] PCP: Maury Dus, MD   Assessment & Plan: Visit Diagnoses:  1. Right shoulder pain, unspecified chronicity   2. Trigger finger, left middle finger   3. Contusion of right index finger without damage to nail, initial encounter   4. Other intervertebral disc degeneration, lumbar region   5. Peroneal tendinitis, right leg     Plan: Patient can continue the splint on his left long finger for trigger finger he is got improvement in his symptoms he went 1 more week he removes of washing his hand and finger and then reapplies it.  If he is having persistent triggering he can return for an injection.  Previous injection in his right hand he states it was very painful.  Patient also continues to have some pain in his back and left leg.  Previous lumbar disc protrusions he responded in the past years ago to an epidural but he gets worse we could consider repeating it.  Contusion of his right hand with the window was negative for fracture and conservative treatment recommended.  Follow-Up Instructions: Return if symptoms worsen or fail to improve.   Orders:  Orders Placed This Encounter  Procedures  . XR Hand Complete Right   No orders of the defined types were placed in this encounter.     Procedures: No procedures performed   Clinical Data: No additional findings.   Subjective: Chief Complaint  Patient presents with  . Right Ankle - Follow-up  . Lower Back - Follow-up  . Left Middle Finger - Follow-up  . Right Hand - Pain    HPI okay patient returns wearing a splint for the left long finger triggering.  Recent injury to his right hand when the window which does not catch properly dropped and smashed his right hand along the radial aspect of  the MCP joint he has soft tissue swelling but did not have any bleeding.  He is having pain when he makes a fist he has been able to extend his finger but has some discomfort with that.  He continues to have problems with his lumbar spine were previously MRI scan several years ago showed lumbar disc degeneration with responded to an epidural.  Review of Systems 14 point review of systems updated unchanged from last office visit.   Objective: Vital Signs: Ht 5\' 5"  (1.651 m)   Wt 175 lb (79.4 kg)   BMI 29.12 kg/m   Physical Exam Constitutional:      Appearance: He is well-developed.  HENT:     Head: Normocephalic and atraumatic.  Eyes:     Pupils: Pupils are equal, round, and reactive to light.  Neck:     Thyroid: No thyromegaly.     Trachea: No tracheal deviation.  Cardiovascular:     Rate and Rhythm: Normal rate.  Pulmonary:     Effort: Pulmonary effort is normal.     Breath sounds: No wheezing.  Abdominal:     General: Bowel sounds are normal.     Palpations: Abdomen is soft.  Skin:    General: Skin is warm and dry.     Capillary  Refill: Capillary refill takes less than 2 seconds.  Neurological:     Mental Status: He is alert and oriented to person, place, and time.  Psychiatric:        Behavior: Behavior normal.        Thought Content: Thought content normal.        Judgment: Judgment normal.     Ortho Exam patient has no triggering of the left thumb.  There is tenderness on the radial side of the hand where window hit his hand.  Sensory is intact.  Collateral ligaments are stable at the MP and PIP joint.  Profundi several my are normal.  Left long finger still has tenderness over the A1 pulley, he has  been wearing a dorsal splint.  Specialty Comments:  No specialty comments available.  Imaging: XR Hand Complete Right  Result Date: 03/28/2019 Three-view x-rays right hand obtained and reviewed.  This shows some soft tissue swelling along the radial aspect of the  first M CP joint.  Negative for fracture no significant degenerative changes. Impression: Radiographs right hand negative for acute fracture.    PMFS History: Patient Active Problem List   Diagnosis Date Noted  . Trigger finger, left middle finger 03/28/2019  . Contusion of finger of right hand 03/28/2019  . Other intervertebral disc degeneration, lumbar region 03/28/2019  . Peroneal tendinitis, right leg 02/19/2019  . Trigger thumb, left thumb 06/10/2017  . Chronic left shoulder pain 10/08/2016  . Neck pain 10/08/2016   Past Medical History:  Diagnosis Date  . Diabetes mellitus without complication (HCC)   . GERD (gastroesophageal reflux disease)   . Hypertension     No family history on file.  Past Surgical History:  Procedure Laterality Date  . CARPAL BOSS EXCISION    . KNEE SURGERY    . ROTATOR CUFF REPAIR     Social History   Occupational History  . Not on file  Tobacco Use  . Smoking status: Never Smoker  . Smokeless tobacco: Never Used  Substance and Sexual Activity  . Alcohol use: No  . Drug use: No  . Sexual activity: Not on file

## 2019-04-04 ENCOUNTER — Telehealth: Payer: Self-pay | Admitting: Orthopaedic Surgery

## 2019-04-04 DIAGNOSIS — M5136 Other intervertebral disc degeneration, lumbar region: Secondary | ICD-10-CM

## 2019-04-04 NOTE — Telephone Encounter (Signed)
Patient Ronald [name] called and stated husband still having back issues and requesting an injection w/[facility].  Please call patient to advise.  [phone]

## 2019-04-04 NOTE — Telephone Encounter (Signed)
Order entered. I called patient's wife and advised.

## 2019-04-04 NOTE — Telephone Encounter (Signed)
Please advise. OK to refer to Dr. Alvester Morin?

## 2019-04-04 NOTE — Telephone Encounter (Signed)
OK - thanks

## 2019-04-18 ENCOUNTER — Ambulatory Visit: Payer: Self-pay

## 2019-04-18 ENCOUNTER — Ambulatory Visit: Payer: BC Managed Care – PPO | Admitting: Physical Medicine and Rehabilitation

## 2019-04-18 ENCOUNTER — Encounter: Payer: Self-pay | Admitting: Physical Medicine and Rehabilitation

## 2019-04-18 ENCOUNTER — Other Ambulatory Visit: Payer: Self-pay

## 2019-04-18 VITALS — BP 141/75 | HR 95

## 2019-04-18 DIAGNOSIS — M5416 Radiculopathy, lumbar region: Secondary | ICD-10-CM | POA: Diagnosis not present

## 2019-04-18 DIAGNOSIS — M25552 Pain in left hip: Secondary | ICD-10-CM | POA: Diagnosis not present

## 2019-04-18 MED ORDER — METHYLPREDNISOLONE ACETATE 80 MG/ML IJ SUSP
40.0000 mg | Freq: Once | INTRAMUSCULAR | Status: AC
Start: 2019-04-18 — End: 2019-04-18
  Administered 2019-04-18: 14:00:00 40 mg

## 2019-04-18 NOTE — Progress Notes (Signed)
Ronald [name] - 69 y.o. male MRN [id number]  Date of birth: 1950/05/19  Office Visit Note: Visit Date: 04/18/2019 PCP: Elias Else, MD Referred by: Elias Else, MD  Subjective: Chief Complaint  Patient presents with  . Lower Back - Pain  . Left Thigh - Pain   HPI: Ronald [name] is a 69 y.o. male who comes in today At the request of Dr. Annell Greening for lumbar epidural injection.  On exam and clinical questioning today the patient has some right-sided lower back pain but really symptoms are more severe on the left into the left thigh.  He says 3 weeks ago he was lifting and he felt like he pulled something.  He is having pain into the left anterior upper thigh and groin down into the knee.  He does feel like wearing a brace seems to help to some degree.  Bending and standing makes it worse.  He has no pain past the knee.  No paresthesia.  He is not have MRI of the lumbar spine.  Have seen him in the past for cervical spine with epidural injection was beneficial.  He does have pain with hip rotation internally today on exam.  Review of Systems  Musculoskeletal: Positive for back pain and joint pain.  All other systems reviewed and are negative.  Otherwise per HPI.  Assessment & Plan: Visit Diagnoses:  1. Pain in left hip   2. Lumbar radiculopathy     Plan: Findings:  Referred for evaluation and possible interventional spine procedure by Dr. Ophelia Charter.  At this point this seems to have declared itself to be more of a hip problem potentially.  X-ray of the hip does not show any significant arthritis but this could be a labral tear etc.  We did complete diagnostic anesthetic arthrogram today and the patient did seem to have relief during the anesthetic phase.  Would consider epidural injection if he just does not get any sustained relief.  Make consider having Dr. Ophelia Charter look at MRI of his lumbar spine if this became more problematic.    Meds & Orders:  Meds ordered this encounter    Medications  . methylPREDNISolone acetate (DEPO-MEDROL) injection 40 mg    Orders Placed This Encounter  Procedures  . Large Joint Inj: L hip joint  . XR C-ARM NO REPORT    Follow-up: Return for visit to requesting physician as needed, consider epidural.   Procedures: Large Joint Inj: L hip joint on 04/18/2019 1:54 PM Indications: pain and diagnostic evaluation Details: 22 G needle, anterior approach  Arthrogram: Yes  Medications: 4 mL bupivacaine 0.25 %; 40 mg triamcinolone acetonide 40 MG/ML Outcome: tolerated well, no immediate complications  Arthrogram demonstrated excellent flow of contrast throughout the joint surface without extravasation or obvious defect.  The patient had relief of symptoms during the anesthetic phase of the injection.  Procedure, treatment alternatives, risks and benefits explained, specific risks discussed. Consent was given by the patient. Immediately prior to procedure a time out was called to verify the correct patient, procedure, equipment, support staff and site/side marked as required. Patient was prepped and draped in the usual sterile fashion.      No notes on file   Clinical History: MRI CERVICAL SPINE WITHOUT CONTRAST  TECHNIQUE: Multiplanar, multisequence MR imaging of the cervical spine was performed. No intravenous contrast was administered.  COMPARISON:  None.  FINDINGS: Alignment: Trace anterolisthesis C5 on C6.  Vertebrae: No fracture worrisome lesion.  Cord: Normal signal throughout.  Posterior Fossa, vertebral arteries, paraspinal tissues: Negative.  Disc levels:  C2-3: Mild facet degenerative change on the left. Otherwise negative.  C3-4:  Negative.  C4-5:  Negative.  C5-6: The patient has left much worse than right facet arthropathy with marrow edema in the left facets. Shallow broad-based right paracentral protrusion is seen but the central canal and foramina are open.  C6-7:   Negative.  C7-T1:  Negative.  IMPRESSION: Dominant finding is facet arthropathy on the left at C5-6 where there is marrow edema about the facet joint. Mild degenerative disc disease without central canal or foraminal stenosis is identified as described above.   Electronically Signed   By: Inge Rise M.D.   On: 01/01/2017 18:2   He reports that he has never smoked. He has never used smokeless tobacco. No results for input(s): HGBA1C, LABURIC in the last 8760 hours.  Objective:  VS:  HT:    WT:   BMI:     BP:(!) 141/75  HR:95bpm  TEMP: ( )  RESP:  Physical Exam Constitutional:      General: He is not in acute distress.    Appearance: Normal appearance. He is not ill-appearing.  HENT:     Head: Normocephalic and atraumatic.     Right Ear: External ear normal.     Left Ear: External ear normal.  Eyes:     Extraocular Movements: Extraocular movements intact.  Cardiovascular:     Rate and Rhythm: Normal rate.     Pulses: Normal pulses.  Abdominal:     General: There is no distension.     Palpations: Abdomen is soft.  Musculoskeletal:        General: No tenderness or signs of injury.     Right lower leg: No edema.     Left lower leg: No edema.     Comments: Patient has good distal strength without clonus.  Seems to have concordant left hip and thigh pain with internal rotation.  Skin:    Findings: No erythema or rash.  Neurological:     General: No focal deficit present.     Mental Status: He is alert and oriented to person, place, and time.     Sensory: No sensory deficit.     Motor: No weakness or abnormal muscle tone.     Coordination: Coordination normal.  Psychiatric:        Mood and Affect: Mood normal.        Behavior: Behavior normal.     Ortho Exam Imaging: No results found.  Past Medical/Family/Surgical/Social History: Medications & Allergies reviewed per EMR, new medications updated. Patient Active Problem List   Diagnosis Date Noted  .  Trigger finger, left middle finger 03/28/2019  . Contusion of finger of right hand 03/28/2019  . Other intervertebral disc degeneration, lumbar region 03/28/2019  . Peroneal tendinitis, right leg 02/19/2019  . Trigger thumb, left thumb 06/10/2017  . Chronic left shoulder pain 10/08/2016  . Neck pain 10/08/2016   Past Medical History:  Diagnosis Date  . Diabetes mellitus without complication ([facility])   . GERD (gastroesophageal reflux disease)   . Hypertension    History reviewed. No pertinent family history. Past Surgical History:  Procedure Laterality Date  . CARPAL BOSS EXCISION    . KNEE SURGERY    . ROTATOR CUFF REPAIR     Social History   Occupational History  . Not on file  Tobacco Use  . Smoking status: Never Smoker  . Smokeless tobacco: Never Used  Substance and Sexual Activity  . Alcohol use: No  . Drug use: No  . Sexual activity: Not on file

## 2019-04-18 NOTE — Progress Notes (Signed)
.  Numeric Pain Rating Scale and Functional Assessment Average Pain 7   In the last MONTH (on 0-10 scale) has pain interfered with the following?  1. General activity like being  able to carry out your everyday physical activities such as walking, climbing stairs, carrying groceries, or moving a chair?  Rating(7)   +Driver, -BT, -Dye Allergies.   

## 2019-04-22 ENCOUNTER — Other Ambulatory Visit: Payer: Self-pay | Admitting: Orthopaedic Surgery

## 2019-04-24 NOTE — Telephone Encounter (Signed)
Please advise 

## 2019-07-31 ENCOUNTER — Encounter: Payer: Self-pay | Admitting: Physical Medicine and Rehabilitation

## 2019-07-31 MED ORDER — BUPIVACAINE HCL 0.25 % IJ SOLN
4.0000 mL | INTRAMUSCULAR | Status: AC | PRN
  Administered 2019-04-18: 4 mL via INTRA_ARTICULAR

## 2019-07-31 MED ORDER — TRIAMCINOLONE ACETONIDE 40 MG/ML IJ SUSP
40.0000 mg | INTRAMUSCULAR | Status: AC | PRN
  Administered 2019-04-18: 14:00:00 40 mg via INTRA_ARTICULAR

## 2019-08-29 ENCOUNTER — Other Ambulatory Visit: Payer: Self-pay | Admitting: Orthopaedic Surgery

## 2019-08-29 NOTE — Telephone Encounter (Signed)
Please advise 

## 2019-10-04 ENCOUNTER — Telehealth: Payer: Self-pay | Admitting: Orthopaedic Surgery

## 2019-10-04 NOTE — Telephone Encounter (Signed)
Patient's wife called.   She stated that his left leg is in an extreme amount of pain and they wont be able to make it to Dr.Yates next available appointment. Wanted to know if they could be worked in sooner.   Call back: [phone]

## 2019-10-04 NOTE — Telephone Encounter (Signed)
Can you please get patient worked in to see Dr Ophelia Charter on Friday afternoon? Thanks.

## 2019-10-06 ENCOUNTER — Ambulatory Visit: Payer: BC Managed Care – PPO | Admitting: Orthopaedic Surgery

## 2019-10-06 ENCOUNTER — Encounter: Payer: Self-pay | Admitting: Orthopaedic Surgery

## 2019-10-06 ENCOUNTER — Ambulatory Visit: Payer: Self-pay

## 2019-10-06 VITALS — BP 141/73 | HR 90 | Ht 65.0 in | Wt 170.0 lb

## 2019-10-06 DIAGNOSIS — S76012A Strain of muscle, fascia and tendon of left hip, initial encounter: Secondary | ICD-10-CM | POA: Diagnosis not present

## 2019-10-06 DIAGNOSIS — M25552 Pain in left hip: Secondary | ICD-10-CM

## 2019-10-06 NOTE — Progress Notes (Signed)
Office Visit Note   Patient: Ronald [name]           Date of Birth: October 05, 1950           MRN: [id number] Visit Date: 10/06/2019              Requested by: Elias Else, MD [phone] [address] [place],  [place] [address] PCP: Elias Else, MD   Assessment & Plan: Visit Diagnoses:  1. Pain in left hip   2. Hip strain, left, initial encounter     Plan: Patient uses a cane in his right hand he has a walker that his wife asked that he could use if needed.  I recommend using anti-inflammatories and get some Aleve take 2 twice a day with food.  Work slip given no work until July 2060 has missed 2 days already.  He will return if he has he has persistent symptoms.  Follow-Up Instructions: Return in about 2 weeks (around 10/20/2019).   Orders:  Orders Placed This Encounter  Procedures  . XR HIP UNILAT W OR W/O PELVIS 2-3 VIEWS LEFT   No orders of the defined types were placed in this encounter.     Procedures: No procedures performed   Clinical Data: No additional findings.   Subjective: Chief Complaint  Patient presents with  . Left Hip - Pain    HPI problem 69 year old male was helping move a heavy desk was 6 million total.  He does not recall any specific injury the following morning woke up he said left groin pain lateral hip pain on the left and some posterior buttocks pain all on the left that radiates down to his knee and stops.  He not been able to walk without a cane he has had significant pain problems sleeping resting.  He took 1 Percocet pain tablet still had pain.  Ice seems to make it worse he is felt somewhat better with heat.  Review of Systems 14 point systems updated unchanged.   Objective: Vital Signs: BP (!) 141/73 (BP Location: Right Arm, Patient Position: Sitting, Cuff Size: Normal)   Pulse 90   Ht 5\' 5"  (1.651 m)   Wt 170 lb (77.1 kg)   BMI 28.29 kg/m   Physical Exam Constitutional:      Appearance: He is well-developed.  HENT:       Head: Normocephalic and atraumatic.  Eyes:     Pupils: Pupils are equal, round, and reactive to light.  Neck:     Thyroid: No thyromegaly.     Trachea: No tracheal deviation.  Cardiovascular:     Rate and Rhythm: Normal rate.  Pulmonary:     Effort: Pulmonary effort is normal.     Breath sounds: No wheezing.  Abdominal:     General: Bowel sounds are normal.     Palpations: Abdomen is soft.  Skin:    General: Skin is warm and dry.     Capillary Refill: Capillary refill takes less than 2 seconds.  Neurological:     Mental Status: He is alert and oriented to person, place, and time.  Psychiatric:        Behavior: Behavior normal.        Thought Content: Thought content normal.        Judgment: Judgment normal.     Ortho Exam patient has no ecchymosis normal pulses.  He has groin pain with internal and external rotation of the left hip moderate in severity trochanteric bursal tenderness  on the left.  Some tenderness anterolaterally over the hip.  Patient has pain with resisted hip flexion.  No pain with abduction or adduction of the hip no knee effusion knee ligaments are stable.  Specialty Comments:  No specialty comments available.  Imaging: XR HIP UNILAT W OR W/O PELVIS 2-3 VIEWS LEFT  Result Date: 10/06/2019 AP pelvis frog-leg left hip demonstrates some mild spurring at the hip minimal joint space narrowing which is symmetrical.  Femoral neck is normal negative for acute fracture.  Slight calcification of the femoral artery. Impression: Left hip x-rays negative for acute changes.    PMFS History: Patient Active Problem List   Diagnosis Date Noted  . Hip strain, left, initial encounter 10/06/2019  . Trigger finger, left middle finger 03/28/2019  . Contusion of finger of right hand 03/28/2019  . Other intervertebral disc degeneration, lumbar region 03/28/2019  . Peroneal tendinitis, right leg 02/19/2019  . Trigger thumb, left thumb 06/10/2017  . Chronic left shoulder  pain 10/08/2016  . Neck pain 10/08/2016   Past Medical History:  Diagnosis Date  . Diabetes mellitus without complication (HCC)   . GERD (gastroesophageal reflux disease)   . Hypertension     History reviewed. No pertinent family history.  Past Surgical History:  Procedure Laterality Date  . CARPAL BOSS EXCISION    . KNEE SURGERY    . ROTATOR CUFF REPAIR     Social History   Occupational History  . Not on file  Tobacco Use  . Smoking status: Never Smoker  . Smokeless tobacco: Never Used  Substance and Sexual Activity  . Alcohol use: No  . Drug use: No  . Sexual activity: Not on file

## 2019-10-11 ENCOUNTER — Telehealth: Payer: Self-pay | Admitting: Orthopaedic Surgery

## 2019-10-11 ENCOUNTER — Telehealth: Payer: Self-pay

## 2019-10-11 DIAGNOSIS — S76012A Strain of muscle, fascia and tendon of left hip, initial encounter: Secondary | ICD-10-CM

## 2019-10-11 DIAGNOSIS — M25552 Pain in left hip: Secondary | ICD-10-CM

## 2019-10-11 NOTE — Telephone Encounter (Signed)
Is MRI for hip without contrast?  Do you want for patient continue out of work until MRI review in office?

## 2019-10-11 NOTE — Telephone Encounter (Signed)
Please advise 

## 2019-10-11 NOTE — Telephone Encounter (Signed)
patient called in saying he is still in pain . Says yates has him going back to work 7/26. Wants to know about further instructions says aleeve is not helping.

## 2019-10-11 NOTE — Telephone Encounter (Signed)
OK to order MRI thanks .

## 2019-10-11 NOTE — Telephone Encounter (Signed)
It is only Wednesday he can give it one or two more days. May get better.

## 2019-10-11 NOTE — Telephone Encounter (Signed)
Patient called requesting a referral for MRI. Patient states the over the counter advil are not helping the pain. Patient also asking for extension out of out of work. Patient states he can barely walk. Please call patient about this matter. Patient's phone number is [phone].

## 2019-10-11 NOTE — Telephone Encounter (Signed)
I called him. He was told to let us know on late Thursday afternoon or Friday since his note last until Monday RTW date. Sooooooo,,,, he will continue ibuprofen, he will call late Thursday or Friday if not able to RTW Monday . MRI is without contrast. Hip pain rule out hip tendonitis , painful hip with Weight bearing thanks

## 2019-10-11 NOTE — Telephone Encounter (Signed)
Duplicate message in chart. Patient to have MRI.

## 2019-10-13 ENCOUNTER — Telehealth: Payer: Self-pay | Admitting: Orthopaedic Surgery

## 2019-10-13 NOTE — Telephone Encounter (Signed)
I left voicemail for patient advising MRI order has been submitted. I advised that our procedure scheduler will pull the order, work on getting insurance authorization, and then the facility will call him to schedule.

## 2019-10-13 NOTE — Telephone Encounter (Signed)
Patient called. He would like a work note. He wants to go back to work on 7/28. His call back number is [phone]

## 2019-10-13 NOTE — Telephone Encounter (Signed)
MRI ordered

## 2019-10-13 NOTE — Telephone Encounter (Signed)
Pt called wanting to check on his MRI; Pt would like a CB from [place].   [phone]

## 2019-10-13 NOTE — Telephone Encounter (Signed)
I called Stanton Kidney, patient's wife.  MRI has been scheduled at [facility] for 11/05/2019 and patient is not going to be able to stay out of work that long. He is having a really difficult time getting around. Do you know if another facility is would be able to get him in sooner? They are willing to travel if needed.   I advised I would not have an answer in regards to another facility until Monday. Patient would like to wait to determine out of work note date depending on MRI appointment. He is currently out of work through Monday.

## 2019-10-17 ENCOUNTER — Telehealth: Payer: Self-pay | Admitting: *Deleted

## 2019-10-17 NOTE — Telephone Encounter (Signed)
I called. Patient has not been able to go to work since 10/04/2019.  He would like to wait and get work note when he comes in for appointment post MRI so that he can have note for total time out of work. Will discuss at return appt

## 2019-10-17 NOTE — Telephone Encounter (Signed)
Pt is scheduled for MRI tomorrow July 28th at [facility] and has a follow up with [facility] next tues. Pt is requesting a work note. Wants to know if should wait until he comes in to get it or can he get it now. Please advise.

## 2019-10-17 NOTE — Telephone Encounter (Signed)
Pt has requested to go elsewhere, I called pt and informed them that I was sending order over to [facility].

## 2019-10-19 ENCOUNTER — Encounter: Payer: Self-pay | Admitting: Orthopaedic Surgery

## 2019-10-19 ENCOUNTER — Other Ambulatory Visit: Payer: Self-pay

## 2019-10-19 ENCOUNTER — Ambulatory Visit (INDEPENDENT_AMBULATORY_CARE_PROVIDER_SITE_OTHER): Payer: BC Managed Care – PPO | Admitting: Orthopaedic Surgery

## 2019-10-19 VITALS — Ht 65.0 in | Wt 170.0 lb

## 2019-10-19 DIAGNOSIS — S76012A Strain of muscle, fascia and tendon of left hip, initial encounter: Secondary | ICD-10-CM

## 2019-10-19 NOTE — Progress Notes (Signed)
Office Visit Note   Patient: Ronald [name]           Date of Birth: Jan 23, 1951           MRN: [id number] Visit Date: 10/19/2019              Requested by: Elias Else, MD [phone] [place],  [place] [address] PCP: Elias Else, MD   Assessment & Plan: Visit Diagnoses:  1. Hip strain, left, initial encounter     Plan: We reviewed with patient and his wife that similar symptoms may be related to the lumbar spine with disc degeneration.  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  He states he like to try returning work on Monday see how he does.  If he has significant increase in symptoms in the next step would be lumbar spine MRI scan.  We plan to check him in 2 weeks.  Follow-Up Instructions: Return in about 2 weeks (around 11/02/2019).   Orders:  No orders of the defined types were placed in this encounter.  No orders of the defined types were placed in this encounter.     Procedures: No procedures performed   Clinical Data: No additional findings.   Subjective: Chief Complaint  Patient presents with  . Left Hip - Pain, Follow-up    MRI left hip review    HPI 69 year old male returns post MRI scan of his hips that were done yesterday.  This was done at [facility] [place].  Impression showed negative for acute fracture or dislocation.  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  Tendinosis of the origin of the hamstrings right side more than his symptomatic left hip.  Degenerative labral changes seen on the left without evidence of a displaced tear.  Patient had mild to moderate degenerative facet changes lower lumbar spine noted.  Patient states that he has been using hairdryer on his thigh heat that seems to help his symptoms.  He has some back pain buttocks pain pains in his  groin and also radiates down into his thigh he states the pain is now been radiating to the anterior shin but not into the foot.  Review of Systems updated unchanged.   Objective: Vital Signs: Ht 5\' 5"  (1.651 m)   Wt 170 lb (77.1 kg)   BMI 28.29 kg/m   Physical Exam Constitutional:      Appearance: He is well-developed.  HENT:     Head: Normocephalic and atraumatic.  Eyes:     Pupils: Pupils are equal, round, and reactive to light.  Neck:     Thyroid: No thyromegaly.     Trachea: No tracheal deviation.  Cardiovascular:     Rate and Rhythm: Normal rate.  Pulmonary:     Effort: Pulmonary effort is normal.     Breath sounds: No wheezing.  Abdominal:     General: Bowel sounds are normal.     Palpations: Abdomen is soft.  Skin:    General: Skin is warm and dry.     Capillary Refill: Capillary refill takes less than 2 seconds.  Neurological:     Mental Status: He is alert and oriented to person, place, and time.  Psychiatric:        Behavior: Behavior normal.        Thought Content: Thought content normal.  Judgment: Judgment normal.     Ortho Exam patient has some discomfort with logroll to the left hip.  Mild tenderness of the trochanter.  Knee reaches full extension good abductor and quad strength right and left.  Distal pulses are 2+.  Specialty Comments:  No specialty comments available.  Imaging: No results found.   PMFS History: Patient Active Problem List   Diagnosis Date Noted  . Hip strain, left, initial encounter 10/06/2019  . Trigger finger, left middle finger 03/28/2019  . Contusion of finger of right hand 03/28/2019  . Other intervertebral disc degeneration, lumbar region 03/28/2019  . Peroneal tendinitis, right leg 02/19/2019  . Trigger thumb, left thumb 06/10/2017  . Chronic left shoulder pain 10/08/2016  . Neck pain 10/08/2016   Past Medical History:  Diagnosis Date  . Diabetes mellitus without complication (HCC)   . GERD  (gastroesophageal reflux disease)   . Hypertension     No family history on file.  Past Surgical History:  Procedure Laterality Date  . CARPAL BOSS EXCISION    . KNEE SURGERY    . ROTATOR CUFF REPAIR     Social History   Occupational History  . Not on file  Tobacco Use  . Smoking status: Never Smoker  . Smokeless tobacco: Never Used  Substance and Sexual Activity  . Alcohol use: No  . Drug use: No  . Sexual activity: Not on file

## 2019-10-24 ENCOUNTER — Ambulatory Visit: Payer: BC Managed Care – PPO | Admitting: Orthopaedic Surgery

## 2019-10-24 ENCOUNTER — Telehealth: Payer: Self-pay | Admitting: Orthopaedic Surgery

## 2019-10-24 NOTE — Telephone Encounter (Signed)
Patient's wife Ronald [name] called requesting a call back from [place]. Patient's wife has medical questions. Please call her at [phone].

## 2019-10-25 ENCOUNTER — Telehealth: Payer: Self-pay | Admitting: Orthopaedic Surgery

## 2019-10-25 DIAGNOSIS — M5136 Other intervertebral disc degeneration, lumbar region: Secondary | ICD-10-CM

## 2019-10-25 NOTE — Telephone Encounter (Signed)
See other note. Spoke with Stanton Kidney.

## 2019-10-25 NOTE — Telephone Encounter (Signed)
Patient's wife Stanton Kidney called about a call back from [place]. Please give patient a call back about medical questions. Patient phone number is [phone].

## 2019-10-25 NOTE — Telephone Encounter (Signed)
OK proceed with lumbar MRI, fix note OOW until ROV to review MRI. Thanks

## 2019-10-25 NOTE — Telephone Encounter (Signed)
Spoke with Ronald [name].  Stated they were seen last week in [place] to review MRI.  He was given a note to return to work but has had a lot of pain and is not getting better. States that they want to proceed with MRI scan that was discussed at last office visit? Please advise.

## 2019-10-26 NOTE — Telephone Encounter (Signed)
IC patients wife and advised order had been put for this. I offered work note but he would like to hold off on this but if he changes his mind will let us know. They also wanted to make sure you were aware they were willing to go wherever needed to get the scan done the soonest.

## 2019-10-28 ENCOUNTER — Other Ambulatory Visit: Payer: BC Managed Care – PPO

## 2019-11-05 ENCOUNTER — Other Ambulatory Visit: Payer: BC Managed Care – PPO

## 2019-11-06 ENCOUNTER — Telehealth: Payer: Self-pay | Admitting: Orthopaedic Surgery

## 2019-11-06 ENCOUNTER — Other Ambulatory Visit: Payer: Self-pay | Admitting: Radiology

## 2019-11-06 DIAGNOSIS — M5136 Other intervertebral disc degeneration, lumbar region: Secondary | ICD-10-CM

## 2019-11-06 NOTE — Telephone Encounter (Signed)
I called patient's wife. She thought PT was being ordered on cervical spine. I advised it is his low back. Wife would like for you to call her to schedule PT once authorized as he cannot answer phone at work. Thanks.

## 2019-11-06 NOTE — Telephone Encounter (Signed)
Patient's wife asked for [facility] to call. Stanton Kidney the spouse says its urgent and can Besty call before she leaves today 8/16. Please call Debra at [phone]

## 2019-11-15 ENCOUNTER — Telehealth: Payer: Self-pay | Admitting: Orthopaedic Surgery

## 2019-11-15 ENCOUNTER — Ambulatory Visit: Payer: BC Managed Care – PPO | Admitting: Physical Therapy

## 2019-11-15 NOTE — Telephone Encounter (Signed)
FYI  Patient's wife had to cancel his PT appointments due to the copay being $72 at each visit. She was wondering if there was somewhere else that could do it cheaper. I advised she could call the number on the back of their insurance card and ask if there is another facility in which the insurance prefers we refer him to or if it is consistently $72/visit no matter what. She will call back to let us know what we need to do to proceed.

## 2019-11-15 NOTE — Telephone Encounter (Signed)
Patient wife Stanton Kidney called requesting a call back from [place]. Stanton Kidney states to have questions about physical therapy appt. Patient phone number is [phone].

## 2019-11-16 ENCOUNTER — Telehealth: Payer: Self-pay | Admitting: Orthopaedic Surgery

## 2019-11-16 DIAGNOSIS — M5136 Other intervertebral disc degeneration, lumbar region: Secondary | ICD-10-CM

## 2019-11-16 NOTE — Telephone Encounter (Signed)
Patient's wife spoke with [employer] and they told her that patient could do PT at [facility] in [place] for $36 a visit. I explained that I would put in a new referral for this location. Patient would like for his wife's cell phone to be called for appt.

## 2019-11-16 NOTE — Telephone Encounter (Signed)
Patient wife Ronald [name] called back to speak with Ronald [name] about PT. Please return patient call about PT updates. Ronald [name] phone number is [phone].

## 2019-12-19 ENCOUNTER — Other Ambulatory Visit: Payer: Self-pay | Admitting: Orthopaedic Surgery

## 2019-12-19 NOTE — Telephone Encounter (Signed)
Pls advise.  

## 2020-02-24 ENCOUNTER — Ambulatory Visit: Payer: BC Managed Care – PPO | Attending: Internal Medicine

## 2020-02-24 DIAGNOSIS — Z23 Encounter for immunization: Secondary | ICD-10-CM

## 2020-02-24 NOTE — Progress Notes (Signed)
   Covid-19 Vaccination Clinic  Name:  Ronald [name]    MRN: [id number] DOB: October 10, 1950  02/24/2020  Mr. Galant was observed post Covid-19 immunization for 15 minutes without incident. He was provided with Vaccine Information Sheet and instruction to access the V-Safe system.   Mr. Keetch was instructed to call 911 with any severe reactions post vaccine: Marland Kitchen Difficulty breathing  . Swelling of face and throat  . A fast heartbeat  . A bad rash all over body  . Dizziness and weakness   Immunizations Administered    Name Date Dose VIS Date Route   Pfizer COVID-19 Vaccine 02/24/2020  9:25 AM 0.3 mL 01/10/2020 Intramuscular   Manufacturer: [employer], [employer]   Lot: [id number]   NDC: [address]-5

## 2020-03-23 HISTORY — PX: COLONOSCOPY: SHX174

## 2020-04-16 ENCOUNTER — Other Ambulatory Visit: Payer: Self-pay | Admitting: Orthopaedic Surgery

## 2020-04-16 NOTE — Telephone Encounter (Signed)
Please advise 

## 2020-08-14 ENCOUNTER — Other Ambulatory Visit: Payer: Self-pay | Admitting: Orthopaedic Surgery

## 2020-08-14 NOTE — Telephone Encounter (Signed)
Please advise 

## 2020-12-08 ENCOUNTER — Other Ambulatory Visit: Payer: Self-pay | Admitting: Orthopaedic Surgery

## 2020-12-09 NOTE — Telephone Encounter (Signed)
Please advise 

## 2021-04-13 ENCOUNTER — Other Ambulatory Visit: Payer: Self-pay | Admitting: Orthopaedic Surgery

## 2021-08-07 ENCOUNTER — Other Ambulatory Visit: Payer: Self-pay | Admitting: Orthopaedic Surgery

## 2021-12-08 ENCOUNTER — Other Ambulatory Visit: Payer: Self-pay | Admitting: Orthopaedic Surgery

## 2022-03-11 ENCOUNTER — Ambulatory Visit: Payer: BC Managed Care – PPO | Admitting: Orthopaedic Surgery

## 2022-03-31 ENCOUNTER — Ambulatory Visit: Payer: Self-pay

## 2022-03-31 ENCOUNTER — Ambulatory Visit (INDEPENDENT_AMBULATORY_CARE_PROVIDER_SITE_OTHER): Payer: BC Managed Care – PPO | Admitting: Orthopaedic Surgery

## 2022-03-31 ENCOUNTER — Encounter: Payer: Self-pay | Admitting: Orthopaedic Surgery

## 2022-03-31 VITALS — BP 127/73 | HR 96 | Ht 65.0 in | Wt 160.5 lb

## 2022-03-31 DIAGNOSIS — M79604 Pain in right leg: Secondary | ICD-10-CM | POA: Diagnosis not present

## 2022-03-31 DIAGNOSIS — M5136 Other intervertebral disc degeneration, lumbar region: Secondary | ICD-10-CM

## 2022-03-31 DIAGNOSIS — M79605 Pain in left leg: Secondary | ICD-10-CM | POA: Diagnosis not present

## 2022-03-31 NOTE — Progress Notes (Signed)
Office Visit Note   Patient: Ronald [name]           Date of Birth: Dec 30, 1950           MRN: [id number] Visit Date: 03/31/2022              Requested by: Maury Dus, MD [address],  [place] [address] PCP: Maury Dus, MD   Assessment & Plan: Visit Diagnoses:  1. Bilateral leg pain     Plan: Patient requesting repeat epidural at L4-5 which previously gave him good relief and he states he thinks the last injection lasted at least 1 year.  He can follow-up with me as needed after the injection.  Follow-Up Instructions: No follow-ups on file.   Orders:  Orders Placed This Encounter  Procedures   XR Lumbar Spine 2-3 Views   XR Pelvis 1-2 Views   No orders of the defined types were placed in this encounter.     Procedures: No procedures performed   Clinical Data: No additional findings.   Subjective: Chief Complaint  Patient presents with   Right Leg - Pain   Left Leg - Pain    HPI 72 year old male seen employed working as had 3 weeks with low back pain from his waist down to his knees worse on the right at times and sometimes in the left.  He states his thighs have been sensitive to touch.  He has had some discomfort in his shoulders if he lays on his right or left side.  He has used heat and ice.  Increased pain if he bends down to lift something off the floor.  Previous epidurals September 2014 given good relief for many months.    Review of Systems 14 point system updated unchanged.   Objective: Vital Signs: BP 127/73   Pulse 96   Ht 5\' 5"  (1.651 m)   Wt 160 lb 8 oz (72.8 kg)   BMI 26.71 kg/m   Physical Exam Constitutional:      Appearance: He is well-developed.  HENT:     Head: Normocephalic and atraumatic.     Right Ear: External ear normal.     Left Ear: External ear normal.  Eyes:     Pupils: Pupils are equal, round, and reactive to light.  Neck:     Thyroid: No thyromegaly.     Trachea: No tracheal deviation.   Cardiovascular:     Rate and Rhythm: Normal rate.  Pulmonary:     Effort: Pulmonary effort is normal.     Breath sounds: No wheezing.  Abdominal:     General: Bowel sounds are normal.     Palpations: Abdomen is soft.  Musculoskeletal:     Cervical back: Neck supple.  Skin:    General: Skin is warm and dry.     Capillary Refill: Capillary refill takes less than 2 seconds.  Neurological:     Mental Status: He is alert and oriented to person, place, and time.  Psychiatric:        Behavior: Behavior normal.        Thought Content: Thought content normal.        Judgment: Judgment normal.     Ortho Exam negative logroll hips.  Reflexes are symmetrical.  Knees reach full extension negative popliteal compression test.  Distal pulses are intact.  Sciatic notch tenderness.  Increased pain forward flexion and extension lumbar spine.  Specialty Comments:  No specialty comments available.  Imaging:  No results found.   PMFS History: Patient Active Problem List   Diagnosis Date Noted   Hip strain, left, initial encounter 10/06/2019   Trigger finger, left middle finger 03/28/2019   Contusion of finger of right hand 03/28/2019   Other intervertebral disc degeneration, lumbar region 03/28/2019   Peroneal tendinitis, right leg 02/19/2019   Trigger thumb, left thumb 06/10/2017   Chronic left shoulder pain 10/08/2016   Neck pain 10/08/2016   Past Medical History:  Diagnosis Date   Diabetes mellitus without complication (HCC)    GERD (gastroesophageal reflux disease)    Hypertension     No family history on file.  Past Surgical History:  Procedure Laterality Date   CARPAL BOSS EXCISION     KNEE SURGERY     ROTATOR CUFF REPAIR     Social History   Occupational History   Not on file  Tobacco Use   Smoking status: Never   Smokeless tobacco: Never  Substance and Sexual Activity   Alcohol use: No   Drug use: No   Sexual activity: Not on file

## 2022-04-06 ENCOUNTER — Telehealth: Payer: Self-pay | Admitting: Physical Medicine and Rehabilitation

## 2022-04-06 NOTE — Telephone Encounter (Signed)
Patient's wife Hilda Blades called asked when can patient schedule an appointment with Dr Ernestina Patches for an epidural injection. The number to contact  Hilda Blades is [phone]

## 2022-04-07 NOTE — Telephone Encounter (Signed)
I tried calling patient to let them know we cannot schedule until auth rec'd by insurance, but was unable to reach patient.

## 2022-04-07 NOTE — Telephone Encounter (Signed)
scheduled

## 2022-04-13 ENCOUNTER — Other Ambulatory Visit: Payer: Self-pay | Admitting: Orthopaedic Surgery

## 2022-04-14 ENCOUNTER — Ambulatory Visit: Payer: Self-pay

## 2022-04-14 ENCOUNTER — Ambulatory Visit: Payer: BC Managed Care – PPO | Admitting: Physical Medicine and Rehabilitation

## 2022-04-14 VITALS — BP 143/71 | HR 93

## 2022-04-14 DIAGNOSIS — M5416 Radiculopathy, lumbar region: Secondary | ICD-10-CM

## 2022-04-14 MED ORDER — METHYLPREDNISOLONE ACETATE 80 MG/ML IJ SUSP
80.0000 mg | Freq: Once | INTRAMUSCULAR | Status: AC
  Administered 2022-04-14: 80 mg

## 2022-04-14 NOTE — Progress Notes (Signed)
Functional Pain Scale - descriptive words and definitions  Uncomfortable (3)  Pain is present but can complete all ADL's/sleep is slightly affected and passive distraction only gives marginal relief. Mild range order  Average Pain  varies    +Driver, -BT, -Dye Allergies.  Lower back pain on both sides that radiates down both legs to the knee

## 2022-04-14 NOTE — Patient Instructions (Signed)

## 2022-04-21 NOTE — Procedures (Signed)
Lumbosacral Transforaminal Epidural Steroid Injection - Sub-Pedicular Approach with Fluoroscopic Guidance  Patient: Ronald [name]      Date of Birth: 1951/01/12 MRN: [id number] PCP: Maury Dus, MD (Inactive)      Visit Date: 04/14/2022   Universal Protocol:    Date/Time: 04/14/2022  Consent Given By: the patient  Position: PRONE  Additional Comments: Vital signs were monitored before and after the procedure. Patient was prepped and draped in the usual sterile fashion. The correct patient, procedure, and site was verified.   Injection Procedure Details:   Procedure diagnoses: Lumbar radiculopathy [M54.16]    Meds Administered:  Meds ordered this encounter  Medications   methylPREDNISolone acetate (DEPO-MEDROL) injection 80 mg    Laterality: Bilateral  Location/Site: L4  Needle:5.0 in., 22 ga.  Short bevel or Quincke spinal needle  Needle Placement: Transforaminal  Findings:    -Comments: Excellent flow of contrast along the nerve, nerve root and into the epidural space.  Procedure Details: After squaring off the end-plates to get a true AP view, the C-arm was positioned so that an oblique view of the foramen as noted above was visualized. The target area is just inferior to the "nose of the scotty dog" or sub pedicular. The soft tissues overlying this structure were infiltrated with 2-3 ml. of 1% Lidocaine without Epinephrine.  The spinal needle was inserted toward the target using a "trajectory" view along the fluoroscope beam.  Under AP and lateral visualization, the needle was advanced so it did not puncture dura and was located close the 6 O'Clock position of the pedical in AP tracterory. Biplanar projections were used to confirm position. Aspiration was confirmed to be negative for CSF and/or blood. A 1-2 ml. volume of Isovue-250 was injected and flow of contrast was noted at each level. Radiographs were obtained for documentation purposes.   After attaining  the desired flow of contrast documented above, a 0.5 to 1.0 ml test dose of 0.25% Marcaine was injected into each respective transforaminal space.  The patient was observed for 90 seconds post injection.  After no sensory deficits were reported, and normal lower extremity motor function was noted,   the above injectate was administered so that equal amounts of the injectate were placed at each foramen (level) into the transforaminal epidural space.   Additional Comments:  No complications occurred Dressing: 2 x 2 sterile gauze and Band-Aid    Post-procedure details: Patient was observed during the procedure. Post-procedure instructions were reviewed.  Patient left the clinic in stable condition.

## 2022-04-21 NOTE — Progress Notes (Signed)
Ronald [name] - 72 y.o. male MRN [id number]  Date of birth: 1950/08/26  Office Visit Note: Visit Date: 04/14/2022 PCP: Maury Dus, MD (Inactive) Referred by: Marybelle Killings, MD  Subjective: Chief Complaint  Patient presents with   Lower Back - Pain   HPI:  Ronald [name] is a 72 y.o. male who comes in today at the request of Dr. Rodell Perna for planned Bilateral L4-5 Lumbar Transforaminal epidural steroid injection with fluoroscopic guidance.  The patient has failed conservative care including home exercise, medications, time and activity modification.  This injection will be diagnostic and hopefully therapeutic.  Please see requesting physician notes for further details and justification.   ROS Otherwise per HPI.  Assessment & Plan: Visit Diagnoses:    ICD-10-CM   1. Lumbar radiculopathy  M54.16 XR C-ARM NO REPORT    Epidural Steroid injection    methylPREDNISolone acetate (DEPO-MEDROL) injection 80 mg      Plan: No additional findings.   Meds & Orders:  Meds ordered this encounter  Medications   methylPREDNISolone acetate (DEPO-MEDROL) injection 80 mg    Orders Placed This Encounter  Procedures   XR C-ARM NO REPORT   Epidural Steroid injection    Follow-up: Return for visit to requesting provider as needed.   Procedures: No procedures performed  Lumbosacral Transforaminal Epidural Steroid Injection - Sub-Pedicular Approach with Fluoroscopic Guidance  Patient: Ronald [name]      Date of Birth: [birth year] MRN: [id number] PCP: Maury Dus, MD (Inactive)      Visit Date: 04/14/2022   Universal Protocol:    Date/Time: 04/14/2022  Consent Given By: the patient  Position: PRONE  Additional Comments: Vital signs were monitored before and after the procedure. Patient was prepped and draped in the usual sterile fashion. The correct patient, procedure, and site was verified.   Injection Procedure Details:   Procedure diagnoses: Lumbar  radiculopathy [M54.16]    Meds Administered:  Meds ordered this encounter  Medications   methylPREDNISolone acetate (DEPO-MEDROL) injection 80 mg    Laterality: Bilateral  Location/Site: L4  Needle:5.0 in., 22 ga.  Short bevel or Quincke spinal needle  Needle Placement: Transforaminal  Findings:    -Comments: Excellent flow of contrast along the nerve, nerve root and into the epidural space.  Procedure Details: After squaring off the end-plates to get a true AP view, the C-arm was positioned so that an oblique view of the foramen as noted above was visualized. The target area is just inferior to the "nose of the scotty dog" or sub pedicular. The soft tissues overlying this structure were infiltrated with 2-3 ml. of 1% Lidocaine without Epinephrine.  The spinal needle was inserted toward the target using a "trajectory" view along the fluoroscope beam.  Under AP and lateral visualization, the needle was advanced so it did not puncture dura and was located close the 6 O'Clock position of the pedical in AP tracterory. Biplanar projections were used to confirm position. Aspiration was confirmed to be negative for CSF and/or blood. A 1-2 ml. volume of Isovue-250 was injected and flow of contrast was noted at each level. Radiographs were obtained for documentation purposes.   After attaining the desired flow of contrast documented above, a 0.5 to 1.0 ml test dose of 0.25% Marcaine was injected into each respective transforaminal space.  The patient was observed for 90 seconds post injection.  After no sensory deficits were reported, and normal lower extremity motor function was noted,   the above injectate  was administered so that equal amounts of the injectate were placed at each foramen (level) into the transforaminal epidural space.   Additional Comments:  No complications occurred Dressing: 2 x 2 sterile gauze and Band-Aid    Post-procedure details: Patient was observed during the  procedure. Post-procedure instructions were reviewed.  Patient left the clinic in stable condition.    Clinical History: No specialty comments available.     Objective:  VS:  HT:    WT:   BMI:     BP:(!) 143/71  HR:93bpm  TEMP: ( )  RESP:  Physical Exam Vitals and nursing note reviewed.  Constitutional:      General: He is not in acute distress.    Appearance: Normal appearance. He is not ill-appearing.  HENT:     Head: Normocephalic and atraumatic.     Right Ear: External ear normal.     Left Ear: External ear normal.     Nose: No congestion.  Eyes:     Extraocular Movements: Extraocular movements intact.  Cardiovascular:     Rate and Rhythm: Normal rate.     Pulses: Normal pulses.  Pulmonary:     Effort: Pulmonary effort is normal. No respiratory distress.  Abdominal:     General: There is no distension.     Palpations: Abdomen is soft.  Musculoskeletal:        General: No tenderness or signs of injury.     Cervical back: Neck supple.     Right lower leg: No edema.     Left lower leg: No edema.     Comments: Patient has good distal strength without clonus.  Skin:    Findings: No erythema or rash.  Neurological:     General: No focal deficit present.     Mental Status: He is alert and oriented to person, place, and time.     Sensory: No sensory deficit.     Motor: No weakness or abnormal muscle tone.     Coordination: Coordination normal.  Psychiatric:        Mood and Affect: Mood normal.        Behavior: Behavior normal.      Imaging: No results found.

## 2022-04-28 ENCOUNTER — Other Ambulatory Visit: Payer: Self-pay | Admitting: Physical Medicine and Rehabilitation

## 2022-04-28 ENCOUNTER — Telehealth: Payer: Self-pay | Admitting: Physical Medicine and Rehabilitation

## 2022-04-28 DIAGNOSIS — M5416 Radiculopathy, lumbar region: Secondary | ICD-10-CM

## 2022-04-28 NOTE — Telephone Encounter (Signed)
Spoke with patient's wife and informed her that MRI was ordered and they will be giving them a call to schedule

## 2022-04-28 NOTE — Telephone Encounter (Signed)
Patient's wife Hilda Blades called advised patient is still in a lot of pain and would like to have the MRI scheduled. The number to contact Hilda Blades is [phone]

## 2022-05-16 ENCOUNTER — Other Ambulatory Visit: Payer: BC Managed Care – PPO

## 2022-05-16 ENCOUNTER — Ambulatory Visit
Admission: RE | Admit: 2022-05-16 | Discharge: 2022-05-16 | Disposition: A | Discharge status: Home / Self Care | Payer: BC Managed Care – PPO | Source: Ambulatory Visit | Attending: Physical Medicine and Rehabilitation | Admitting: Physical Medicine and Rehabilitation

## 2022-05-16 DIAGNOSIS — M5416 Radiculopathy, lumbar region: Secondary | ICD-10-CM

## 2022-05-18 ENCOUNTER — Other Ambulatory Visit: Payer: Self-pay | Admitting: Family Medicine

## 2022-05-18 DIAGNOSIS — I77811 Abdominal aortic ectasia: Secondary | ICD-10-CM

## 2022-05-19 ENCOUNTER — Telehealth: Payer: Self-pay

## 2022-05-19 ENCOUNTER — Telehealth: Payer: Self-pay | Admitting: Orthopaedic Surgery

## 2022-05-19 NOTE — Telephone Encounter (Signed)
I  worked in for this Friday, 05/22/2022 at 3:30p.  Left voicemail for patient advising. Asked for him to return call if this appointment will not work for him.

## 2022-05-19 NOTE — Telephone Encounter (Signed)
Patient just made an appointment to have his MRI results given with Dr. Lorin Mercy on March 8th at 1:15, patient would like Betsy to call and see if he can be seen sooner due to discomfort. Please advise

## 2022-05-19 NOTE — Telephone Encounter (Signed)
LVM to return call to schedule OV

## 2022-05-19 NOTE — Telephone Encounter (Signed)
-----   Message from Lorine Bears, NP sent at 05/19/2022  8:56 AM EST ----- OV to discuss lumbar MRI results. Thanks ----- Message ----- From: Interface, Rad Results In Sent: 05/19/2022   8:53 AM EST To: Lorine Bears, NP

## 2022-05-22 ENCOUNTER — Encounter: Payer: Self-pay | Admitting: Orthopaedic Surgery

## 2022-05-22 ENCOUNTER — Ambulatory Visit: Payer: BC Managed Care – PPO | Admitting: Orthopaedic Surgery

## 2022-05-22 VITALS — BP 117/64 | HR 97 | Ht 65.0 in | Wt 160.0 lb

## 2022-05-22 DIAGNOSIS — M5126 Other intervertebral disc displacement, lumbar region: Secondary | ICD-10-CM | POA: Diagnosis not present

## 2022-05-22 DIAGNOSIS — M5136 Other intervertebral disc degeneration, lumbar region: Secondary | ICD-10-CM

## 2022-05-22 MED ORDER — GABAPENTIN 300 MG PO CAPS: 300.0000 mg | ORAL_CAPSULE | Freq: Every day | ORAL | 2 refills | Status: DC

## 2022-05-22 NOTE — Progress Notes (Unsigned)
   Office Visit Note   Patient: Ronald [name]           Date of Birth: [birth year]           MRN: [id number] Visit Date: 05/22/2022              Requested by: No referring provider defined for this encounter. PCP: Maury Dus, MD (Inactive)   Assessment & Plan: Visit Diagnoses: No diagnosis found.  Plan: ***  Follow-Up Instructions: No follow-ups on file.   Orders:  No orders of the defined types were placed in this encounter.  No orders of the defined types were placed in this encounter.     Procedures: No procedures performed   Clinical Data: No additional findings.   Subjective: Chief Complaint  Patient presents with   Lower Back - Pain, Follow-up    MRI review    HPI  Review of Systems   Objective: Vital Signs: BP 117/64   Pulse 97   Ht '5\' 5"'$  (1.651 m)   Wt 160 lb (72.6 kg)   BMI 26.63 kg/m   Physical Exam  Ortho Exam  Specialty Comments:  No specialty comments available.  Imaging: No results found.   PMFS History: Patient Active Problem List   Diagnosis Date Noted   Hip strain, left, initial encounter 10/06/2019   Trigger finger, left middle finger 03/28/2019   Contusion of finger of right hand 03/28/2019   Other intervertebral disc degeneration, lumbar region 03/28/2019   Peroneal tendinitis, right leg 02/19/2019   Trigger thumb, left thumb 06/10/2017   Chronic left shoulder pain 10/08/2016   Neck pain 10/08/2016   Past Medical History:  Diagnosis Date   Diabetes mellitus without complication ([place])    GERD (gastroesophageal reflux disease)    Hypertension     No family history on file.  Past Surgical History:  Procedure Laterality Date   CARPAL BOSS EXCISION     KNEE SURGERY     ROTATOR CUFF REPAIR     Social History   Occupational History   Not on file  Tobacco Use   Smoking status: Never   Smokeless tobacco: Never  Substance and Sexual Activity   Alcohol use: No   Drug use: No   Sexual activity: Not on  file

## 2022-05-25 ENCOUNTER — Telehealth: Payer: Self-pay

## 2022-05-25 NOTE — Telephone Encounter (Signed)
-----   Message from Megan E Williams, NP sent at 05/19/2022  8:56 AM EST ----- OV to discuss lumbar MRI results. Thanks ----- Message ----- From: Interface, Rad Results In Sent: 05/19/2022   8:53 AM EST To: Megan E Williams, NP   

## 2022-05-25 NOTE — Telephone Encounter (Signed)
LVM to return call to schedule OV for MRI review

## 2022-05-29 ENCOUNTER — Ambulatory Visit: Payer: BC Managed Care – PPO | Admitting: Orthopaedic Surgery

## 2022-06-08 ENCOUNTER — Ambulatory Visit: Payer: BC Managed Care – PPO | Admitting: Physical Therapy

## 2022-06-08 ENCOUNTER — Ambulatory Visit
Admission: RE | Admit: 2022-06-08 | Discharge: 2022-06-08 | Disposition: A | Discharge status: Home / Self Care | Payer: BC Managed Care – PPO | Source: Ambulatory Visit | Attending: Family Medicine | Admitting: Family Medicine

## 2022-06-08 DIAGNOSIS — I77811 Abdominal aortic ectasia: Secondary | ICD-10-CM

## 2022-06-21 ENCOUNTER — Encounter: Payer: Self-pay | Admitting: Cardiovascular Disease

## 2022-06-21 NOTE — Progress Notes (Unsigned)
Cardiology Office Note:    Date:  06/22/2022   ID:  Ronald [name], DOB June 08, 1950, MRN [id number]  PCP:  Maury Dus, MD (Inactive)   [facility] Prov[facility] Cardiologist:  Nevia Henkin   Referring MD: Caren Macadam, MD   Chief Complaint  Patient presents with   Heart Murmur    History of Present Illness:   June 22, 2022   Seen with wife , Ronald [name] is a 72 y.o. 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 They recommended follow-up ultrasound in 5 years.  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  Still uses sea salt   Still does some mowing - has severe pain  Cannot do any weedeating yet  Going to see a doctor    Past Medical History:  Diagnosis Date   Carpal tunnel syndrome    Diabetes mellitus without complication    Diabetic peripheral neuropathy    Diabetic renal disease    Elevated hemoglobin    Esophageal polyp    GERD (gastroesophageal reflux disease)    Heart murmur    HLD (hyperlipidemia)    Hyperglycemia due to type 2 diabetes mellitus    Hyperplasia of prostate    Hypertension    Hypertensive retinopathy    Hypogonadism in male    Kidney disease, chronic, stage III (GFR 30-59 ml/min)    Rotator cuff tear    Thrombocytosis    Trigger finger     Past Surgical History:  Procedure Laterality Date   CARPAL BOSS EXCISION     KNEE SURGERY     ROTATOR CUFF REPAIR      Current Medications: Current Meds  Medication Sig   amLODipine (NORVASC) 5 MG tablet    Ascorbic Acid (VITAMIN C) 1000 MG tablet Take 1,000 mg by mouth daily.   atorvastatin (LIPITOR) 40 MG tablet    Cholecalciferol 1000 units capsule Take by mouth.   Continuous Blood Gluc Sensor (FREESTYLE LIBRE 2 SENSOR) MISC APPLY one sensor TO THE fore  back of upper arm EVERY 14 DAYS AS DIRECTED   gabapentin (NEURONTIN) 300 MG capsule Take 1 capsule (300 mg total) by mouth at bedtime.   glipiZIDE (GLUCOTROL XL) 5 MG 24 hr tablet Take 5 mg by mouth daily.   hydrochlorothiazide (HYDRODIURIL) 12.5 MG tablet Take 12.5 mg by mouth every morning.   JANUMET XR 50-1000 MG TB24    JARDIANCE 25 MG TABS tablet    losartan (COZAAR) 100 MG tablet Take 100 mg by mouth every morning.   metFORMIN (GLUCOPHAGE-XR) 500 MG 24 hr tablet Take 1,000 mg by mouth 2 (two) times daily.   metoprolol succinate (TOPROL XL) 25 MG 24 hr tablet Take 1 tablet (25 mg total) by mouth daily.   Multiple Vitamin (MULTIVITAMIN WITH MINERALS) TABS tablet Take 1 tablet by mouth daily.   Omega-3 Fatty Acids (FISH OIL) 1000 MG CAPS Take by mouth.   OZEMPIC, 2 MG/DOSE, 8 MG/3ML SOPN Inject 2 mg into the skin once a week.   tamsulosin (FLOMAX) 0.4 MG CAPS capsule    Testosterone 1.62 % GEL SMARTSIG:3 Pump Topical Daily   vitamin B-12 (CYANOCOBALAMIN) 1000 MCG tablet Take 1,000 mcg by mouth daily.  Allergies:   Aspirin and Penicillins   Social History   Socioeconomic History   Marital status: Married    Spouse name: Not on file   Number of children: Not on file   Years of education: Not on file   Highest education level: Not on file  Occupational History   Not on file  Tobacco Use   Smoking status: Never   Smokeless tobacco: Never  Substance and Sexual Activity   Alcohol use: No   Drug use: No   Sexual activity: Not on file  Other Topics Concern   Not on file  Social History Narrative   Not on file   Social Determinants of Health   Financial Resource Strain: Not on file  Food Insecurity: Not on file  Transportation Needs: Not on file  Physical Activity: Not on file  Stress: Not on file  Social Connections: Not on file     Family History: The patient's family history is not on file.  ROS:   Please see the history of present illness.     All other  systems reviewed and are negative.  EKGs/Labs/Other Studies Reviewed:    The following studies were reviewed today:   EKG:   June 22, 2022:   NSR at 91.   NS T wave abn.   Recent Labs: No results found for requested labs within last 365 days.  Recent Lipid Panel No results found for: "CHOL", "TRIG", "HDL", "CHOLHDL", "VLDL", "LDLCALC", "LDLDIRECT"   Risk Assessment/Calculations:                Physical Exam:    VS:  BP 132/70   Pulse 91   Ht 5' 4.5" (1.638 m)   Wt 161 lb 6.4 oz (73.2 kg)   SpO2 95%   BMI 27.28 kg/m     Wt Readings from Last 3 Encounters:  06/22/22 161 lb 6.4 oz (73.2 kg)  05/22/22 160 lb (72.6 kg)  03/31/22 160 lb 8 oz (72.8 kg)     GEN:  Well nourished, well developed in no acute distress HEENT: Normal NECK: No JVD; No carotid bruits LYMPHATICS: No lymphadenopathy CARDIAC: RR , soft systolic murmur at toward L axillary  RESPIRATORY:  Clear to auscultation without rales, wheezing or rhonchi  ABDOMEN: Soft, non-tender, non-distended MUSCULOSKELETAL:  No edema; No deformity  SKIN: Warm and dry NEUROLOGIC:  Alert and oriented x 3 PSYCHIATRIC:  Normal affect   ASSESSMENT:    1. Murmur, cardiac    PLAN:    In order of problems listed above:  Systolic murmur :   murmur is C/W MR .   Will get an echo   2.  HTN:  still uses lots of salt .  Advised salt substitutes.    Add Toprol XL to help with his tachycardia and BP            Medication Adjustments/Labs and Tests Ordered: Current medicines are reviewed at length with the patient today.  Concerns regarding medicines are outlined above.  Orders Placed This Encounter  Procedures   EKG 12-Lead   ECHOCARDIOGRAM COMPLETE   Meds ordered this encounter  Medications   metoprolol succinate (TOPROL XL) 25 MG 24 hr tablet    Sig: Take 1 tablet (25 mg total) by mouth daily.    Dispense:  90 tablet    Refill:  3    Patient Instructions  Medication Instructions:  START Metoprolol  Succinate (Toprol XL) 25mg  daily *If you need a refill on your  cardiac medications before your next appointment, please call your pharmacy*  Lab Work: NONE If you have labs (blood work) drawn today and your tests are completely normal, you will receive your results only by: [place] (if you have MyChart) OR A paper copy in the mail If you have any lab test that is abnormal or we need to change your treatment, we will call you to review the results.  Testing/Procedures: ECHO Your physician has requested that you have an echocardiogram. Echocardiography is a painless test that uses sound waves to create images of your heart. It provides your doctor with information about the size and shape of your heart and how well your heart's chambers and valves are working. This procedure takes approximately one hour. There are no restrictions for this procedure. Please do NOT wear cologne, perfume, aftershave, or lotions (deodorant is allowed). Please arrive 15 minutes prior to your appointment time.  Follow-Up: At [facility], you and your health needs are our priority.  As part of our continuing mission to provide you with exceptional heart care, we have created designated Provider Care Teams.  These Care Teams include your primary Cardiologist (physician) and Advanced Practice Providers (APPs -  Physician Assistants and Nurse Practitioners) who all work together to provide you with the care you need, when you need it.  We recommend signing up for the patient portal called "MyChart".  Sign up information is provided on this After Visit Summary.  MyChart is used to connect with patients for Virtual Visits (Telemedicine).  Patients are able to view lab/test results, encounter notes, upcoming appointments, etc.  Non-urgent messages can be sent to your provider as well.   To learn more about what you can do with MyChart, go to [email or web address].    Your next appointment:   6  month(s)  Provider:   Mertie Moores, MD     Signed, Mertie Moores, MD  06/22/2022 5:19 PM    [place]

## 2022-06-22 ENCOUNTER — Ambulatory Visit: Payer: BC Managed Care – PPO | Attending: Cardiovascular Disease | Admitting: Cardiovascular Disease

## 2022-06-22 ENCOUNTER — Encounter: Payer: Self-pay | Admitting: Cardiovascular Disease

## 2022-06-22 VITALS — BP 132/70 | HR 91 | Ht 64.5 in | Wt 161.4 lb

## 2022-06-22 DIAGNOSIS — R011 Cardiac murmur, unspecified: Secondary | ICD-10-CM

## 2022-06-22 MED ORDER — METOPROLOL SUCCINATE ER 25 MG PO TB24: 25.0000 mg | ORAL_TABLET | Freq: Every day | ORAL | 3 refills | Status: DC

## 2022-06-22 NOTE — Patient Instructions (Signed)
Medication Instructions:  START Metoprolol Succinate (Toprol XL) 25mg  daily *If you need a refill on your cardiac medications before your next appointment, please call your pharmacy*  Lab Work: NONE If you have labs (blood work) drawn today and your tests are completely normal, you will receive your results only by: [place] (if you have MyChart) OR A paper copy in the mail If you have any lab test that is abnormal or we need to change your treatment, we will call you to review the results.  Testing/Procedures: ECHO Your physician has requested that you have an echocardiogram. Echocardiography is a painless test that uses sound waves to create images of your heart. It provides your doctor with information about the size and shape of your heart and how well your heart's chambers and valves are working. This procedure takes approximately one hour. There are no restrictions for this procedure. Please do NOT wear cologne, perfume, aftershave, or lotions (deodorant is allowed). Please arrive 15 minutes prior to your appointment time.  Follow-Up: At [facility], you and your health needs are our priority.  As part of our continuing mission to provide you with exceptional heart care, we have created designated Provider Care Teams.  These Care Teams include your primary Cardiologist (physician) and Advanced Practice Providers (APPs -  Physician Assistants and Nurse Practitioners) who all work together to provide you with the care you need, when you need it.  We recommend signing up for the patient portal called "MyChart".  Sign up information is provided on this After Visit Summary.  MyChart is used to connect with patients for Virtual Visits (Telemedicine).  Patients are able to view lab/test results, encounter notes, upcoming appointments, etc.  Non-urgent messages can be sent to your provider as well.   To learn more about what you can do with MyChart, go to [email or web address].     Your next appointment:   6 month(s)  Provider:   Mertie Moores, MD

## 2022-06-26 ENCOUNTER — Ambulatory Visit: Payer: BC Managed Care – PPO | Admitting: Orthopaedic Surgery

## 2022-06-26 VITALS — BP 146/76 | HR 96 | Ht 64.5 in | Wt 161.0 lb

## 2022-06-26 DIAGNOSIS — M79605 Pain in left leg: Secondary | ICD-10-CM | POA: Diagnosis not present

## 2022-06-26 DIAGNOSIS — M5126 Other intervertebral disc displacement, lumbar region: Secondary | ICD-10-CM | POA: Diagnosis not present

## 2022-06-26 DIAGNOSIS — M5136 Other intervertebral disc degeneration, lumbar region: Secondary | ICD-10-CM

## 2022-06-26 DIAGNOSIS — M79604 Pain in right leg: Secondary | ICD-10-CM

## 2022-06-26 MED ORDER — GABAPENTIN 300 MG PO CAPS: 300.0000 mg | ORAL_CAPSULE | Freq: Three times a day (TID) | ORAL | 2 refills | Status: DC

## 2022-06-26 NOTE — Progress Notes (Unsigned)
   Office Visit Note   Patient: Ronald [name]           Date of Birth: Nov 03, 1950           MRN: [id number] Visit Date: 06/26/2022              Requested by: No referring provider defined for this encounter. PCP: Elias Else, MD (Inactive)   Assessment & Plan: Visit Diagnoses: No diagnosis found.  Plan: ***  Follow-Up Instructions: No follow-ups on file.   Orders:  No orders of the defined types were placed in this encounter.  No orders of the defined types were placed in this encounter.     Procedures: No procedures performed   Clinical Data: No additional findings.   Subjective: Chief Complaint  Patient presents with   Lower Back - Pain, Follow-up    HPI  Review of Systems   Objective: Vital Signs: BP (!) 146/76   Pulse 96   Ht 5' 4.5" (1.638 m)   Wt 161 lb (73 kg)   BMI 27.21 kg/m   Physical Exam  Ortho Exam  Specialty Comments:  No specialty comments available.  Imaging: No results found.   PMFS History: Patient Active Problem List   Diagnosis Date Noted   Protrusion of lumbar intervertebral disc 05/22/2022   Hip strain, left, initial encounter 10/06/2019   Trigger finger, left middle finger 03/28/2019   Contusion of finger of right hand 03/28/2019   Other intervertebral disc degeneration, lumbar region 03/28/2019   Peroneal tendinitis, right leg 02/19/2019   Trigger thumb, left thumb 06/10/2017   Chronic left shoulder pain 10/08/2016   Neck pain 10/08/2016   Past Medical History:  Diagnosis Date   Carpal tunnel syndrome    Diabetes mellitus without complication    Diabetic peripheral neuropathy    Diabetic renal disease    Elevated hemoglobin    Esophageal polyp    GERD (gastroesophageal reflux disease)    Heart murmur    HLD (hyperlipidemia)    Hyperglycemia due to type 2 diabetes mellitus    Hyperplasia of prostate    Hypertension    Hypertensive retinopathy    Hypogonadism in male    Kidney disease, chronic,  stage III (GFR 30-59 ml/min)    Rotator cuff tear    Thrombocytosis    Trigger finger     No family history on file.  Past Surgical History:  Procedure Laterality Date   CARPAL BOSS EXCISION     KNEE SURGERY     ROTATOR CUFF REPAIR     Social History   Occupational History   Not on file  Tobacco Use   Smoking status: Never   Smokeless tobacco: Never  Substance and Sexual Activity   Alcohol use: No   Drug use: No   Sexual activity: Not on file

## 2022-06-30 LAB — URIC ACID: Uric Acid, Serum: 6 mg/dL (ref 4.0–8.0)

## 2022-06-30 LAB — ANA: Anti Nuclear Antibody (ANA): NEGATIVE

## 2022-06-30 LAB — SEDIMENTATION RATE: Sed Rate: 2 mm/h (ref 0–20)

## 2022-06-30 LAB — C-REACTIVE PROTEIN: CRP: 0.8 mg/L (ref <8.0)

## 2022-07-02 ENCOUNTER — Telehealth: Payer: Self-pay | Admitting: Orthopaedic Surgery

## 2022-07-02 NOTE — Telephone Encounter (Signed)
Patient's wife Ronald [name] request a call back with test results from lab work.

## 2022-07-02 NOTE — Telephone Encounter (Signed)
I called [facility] and advised.

## 2022-07-02 NOTE — Telephone Encounter (Signed)
Please advise 

## 2022-07-08 ENCOUNTER — Encounter: Payer: Self-pay | Admitting: Orthopedic Surgery

## 2022-07-08 ENCOUNTER — Other Ambulatory Visit (INDEPENDENT_AMBULATORY_CARE_PROVIDER_SITE_OTHER): Payer: BC Managed Care – PPO

## 2022-07-08 ENCOUNTER — Ambulatory Visit (INDEPENDENT_AMBULATORY_CARE_PROVIDER_SITE_OTHER): Payer: BC Managed Care – PPO | Admitting: Orthopedic Surgery

## 2022-07-08 VITALS — BP 119/65 | HR 105 | Ht 64.5 in | Wt 161.0 lb

## 2022-07-08 DIAGNOSIS — M5126 Other intervertebral disc displacement, lumbar region: Secondary | ICD-10-CM | POA: Diagnosis not present

## 2022-07-08 DIAGNOSIS — M5416 Radiculopathy, lumbar region: Secondary | ICD-10-CM

## 2022-07-08 DIAGNOSIS — M5136 Other intervertebral disc degeneration, lumbar region: Secondary | ICD-10-CM

## 2022-07-08 DIAGNOSIS — M51369 Other intervertebral disc degeneration, lumbar region without mention of lumbar back pain or lower extremity pain: Secondary | ICD-10-CM

## 2022-07-08 NOTE — Progress Notes (Addendum)
Orthopedic Spine Surgery Office Note  Assessment: Patient is a 72 y.o. male with low back pain that radiates into bilateral lower extremities.  Feels it along the anterior lateral aspect of the thigh into the lateral leg and dorsum of the foot.  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  Possible L5 radiculopathy   Plan: -Since he did not get any relief with a targeted injection to L4, recommended a transforaminal injection targeting L5 since I think his lateral recess stenosis and foraminal stenosis causing an L5 radiculopathy.  Referral provided for that injection -Can continue with gabapentin and Tylenol -Would need to review A1C prior to any elective surgical intervention. He said he will get his most recent one from February to me -Patient should return to office in 3 weeks, x-rays at next visit: None   Patient expressed understanding of the plan and all questions were answered to the patient's satisfaction.   ___________________________________________________________________________   History:  Patient is a 72 y.o. male who presents today for lumbar spine.  Patient has had 3 months of low back pain that radiates into the bilateral lower extremities.  He feels it along the anterior lateral aspect of the thigh and into the lateral aspect of the leg to the dorsum of the foot.  Pain is worse with lifting or standing for prolonged period of time.  He works as a Education administrator.  There is no trauma or injury that brought on the pain.  He has a history of neuropathy with decreased sensation in his bilateral feet.  No new numbness or paresthesias.  Pain is rated as a 9/10. Previous injection to L4 did not give him significant relief. He cannot recall if it gave him any short term relief.   Weakness: Yes, his legs feel weaker at times.  No other weakness noted Symptoms of imbalance: Denies Paresthesias and numbness: Yes, has neuropathy but no recent new  paresthesias or numbness Bowel or bladder incontinence: Denies Saddle anesthesia: Denies  Treatments tried: Tylenol, gabapentin, chiropractor, L4 lumbar injection  Review of systems: Denies fevers and chills, night sweats, unexplained weight loss, history of cancer.  Has had pain that wakes him at night  Past medical history: HLD DM with neuropathy GERD BPH HTN CKD  Allergies: aspirin, penicillin  Past surgical history:  Bilateral shoulder rotator cuff repair Parathyroid excision Bilateral carpal tunnel release Bilateral knee arthroscopy  Social history: Denies use of nicotine product (smoking, vaping, patches, smokeless) Alcohol use: Denies Denies recreational drug use   Physical Exam:  BMI 27.2  General: no acute distress, appears stated age Neurologic: alert, answering questions appropriately, following commands Respiratory: unlabored breathing on room air, symmetric chest rise Psychiatric: appropriate affect, normal cadence to speech   MSK (spine):  -Strength exam      Left  Right EHL    5/5  5/5 TA    5/5  5/5 GSC    5/5  5/5 Knee extension  5/5  5/5 Hip flexion   5/5  5/5  -Sensory exam    Sensation intact to light touch in L3-S1 nerve distributions of bilateral lower extremities  -Achilles DTR: 2/4 on the left, 2/4 on the right -Patellar tendon DTR: 2/4 on the left, 2/4 on the right  -Straight leg raise: Negative bilaterally -Femoral nerve stretch test: Negative bilaterally -Clonus: no beats bilaterally  -Left hip exam: No pain through range of motion, negative Stinchfield, negative FABER -Right hip exam: No pain through range of motion,  negative Stinchfield, negative FABER  Imaging: XR of the lumbar spine from 03/31/2022 and 07/08/2022 was independently reviewed and interpreted, showing osteophyte formation at L3/4 and L4/5.  No other significant degenerative changes.  No evidence of instability on flexion/extension views.  Osteophyte formation  seen on the right hip.  No fracture or dislocation.  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  Bilateral L5/S1 foraminal stenosis.   Patient name: Ronald [name] Patient MRN: [id number] Date of visit: 07/08/22

## 2022-07-13 ENCOUNTER — Telehealth: Payer: Self-pay | Admitting: Physical Medicine and Rehabilitation

## 2022-07-13 NOTE — Telephone Encounter (Signed)
Patient missed a call and asked to please call back.Marland Kitchen

## 2022-07-13 NOTE — Telephone Encounter (Signed)
LVM to return call to schedule injection 

## 2022-07-14 ENCOUNTER — Telehealth: Payer: Self-pay | Admitting: Physical Medicine and Rehabilitation

## 2022-07-14 NOTE — Telephone Encounter (Signed)
Patient advising the are returning a call.

## 2022-07-14 NOTE — Telephone Encounter (Signed)
Scheduled

## 2022-07-14 NOTE — Telephone Encounter (Signed)
Wife asking for Ronald [name] to call her number..[phone]

## 2022-07-14 NOTE — Telephone Encounter (Signed)
scheduled

## 2022-07-15 ENCOUNTER — Ambulatory Visit: Payer: BC Managed Care – PPO | Admitting: Orthopedic Surgery

## 2022-07-16 ENCOUNTER — Ambulatory Visit (HOSPITAL_COMMUNITY): Payer: BC Managed Care – PPO | Attending: Cardiovascular Disease

## 2022-07-16 DIAGNOSIS — R011 Cardiac murmur, unspecified: Secondary | ICD-10-CM

## 2022-07-17 LAB — ECHOCARDIOGRAM COMPLETE
AR max vel: 1.09 cm2
AV Area VTI: 1.13 cm2
AV Area mean vel: 1.08 cm2
AV Mean grad: 11.4 mmHg
AV Peak grad: 20.7 mmHg
Ao pk vel: 2.27 m/s
Area-P 1/2: 4.19 cm2
S' Lateral: 3.3 cm

## 2022-07-20 ENCOUNTER — Other Ambulatory Visit: Payer: Self-pay

## 2022-07-20 ENCOUNTER — Ambulatory Visit: Payer: BC Managed Care – PPO | Admitting: Physical Medicine and Rehabilitation

## 2022-07-20 VITALS — BP 142/82 | HR 89

## 2022-07-20 DIAGNOSIS — M5416 Radiculopathy, lumbar region: Secondary | ICD-10-CM | POA: Diagnosis not present

## 2022-07-20 MED ORDER — METHYLPREDNISOLONE ACETATE 80 MG/ML IJ SUSP
80.0000 mg | Freq: Once | INTRAMUSCULAR | Status: AC
Start: 2022-07-20 — End: 2022-07-20
  Administered 2022-07-20: 80 mg

## 2022-07-20 NOTE — Progress Notes (Unsigned)
Functional Pain Scale - descriptive words and definitions  Distracting (5)    Aware of pain/able to complete some ADL's but limited by pain/sleep is affected and active distractions are only slightly useful. Moderate range order  Average Pain 6   +Driver, -BT, -Dye Allergies.  Lower back pain on both sides that radiates into both legs all the way down

## 2022-07-20 NOTE — Patient Instructions (Signed)

## 2022-07-21 NOTE — Progress Notes (Signed)
Ronald [name] - 72 y.o. male MRN [id number]  Date of birth: June 10, 1950  Office Visit Note: Visit Date: 07/20/2022 PCP: Elias Else, MD (Inactive) Referred by: London Sheer, MD  Subjective: Chief Complaint  Patient presents with   Lower Back - Pain   HPI:  Ronald [name] is a 72 y.o. male who comes in today at the request of Dr. Willia Craze for planned Bilateral L5-S1 Lumbar Transforaminal epidural steroid injection with fluoroscopic guidance.  The patient has failed conservative care including home exercise, medications, time and activity modification.  This injection will be diagnostic and hopefully therapeutic.  Please see requesting physician notes for further details and justification.   ROS Otherwise per HPI.  Assessment & Plan: Visit Diagnoses:    ICD-10-CM   1. Lumbar radiculopathy  M54.16 XR C-ARM NO REPORT    Epidural Steroid injection    methylPREDNISolone acetate (DEPO-MEDROL) injection 80 mg      Plan: No additional findings.   Meds & Orders:  Meds ordered this encounter  Medications   methylPREDNISolone acetate (DEPO-MEDROL) injection 80 mg    Orders Placed This Encounter  Procedures   XR C-ARM NO REPORT   Epidural Steroid injection    Follow-up: Return for visit to requesting provider as needed.   Procedures: No procedures performed  Lumbosacral Transforaminal Epidural Steroid Injection - Sub-Pedicular Approach with Fluoroscopic Guidance  Patient: Ronald [name]      Date of Birth: October 24, 1950 MRN: [id number] PCP: Elias Else, MD (Inactive)      Visit Date: 07/20/2022   Universal Protocol:    Date/Time: 07/20/2022  Consent Given By: the patient  Position: PRONE  Additional Comments: Vital signs were monitored before and after the procedure. Patient was prepped and draped in the usual sterile fashion. The correct patient, procedure, and site was verified.   Injection Procedure Details:   Procedure diagnoses: Lumbar  radiculopathy [M54.16]    Meds Administered:  Meds ordered this encounter  Medications   methylPREDNISolone acetate (DEPO-MEDROL) injection 80 mg    Laterality: Bilateral  Location/Site: L5  Needle:5.0 in., 22 ga.  Short bevel or Quincke spinal needle  Needle Placement: Transforaminal  Findings:    -Comments: Excellent flow of contrast along the nerve, nerve root and into the epidural space.  Procedure Details: After squaring off the end-plates to get a true AP view, the C-arm was positioned so that an oblique view of the foramen as noted above was visualized. The target area is just inferior to the "nose of the scotty dog" or sub pedicular. The soft tissues overlying this structure were infiltrated with 2-3 ml. of 1% Lidocaine without Epinephrine.  The spinal needle was inserted toward the target using a "trajectory" view along the fluoroscope beam.  Under AP and lateral visualization, the needle was advanced so it did not puncture dura and was located close the 6 O'Clock position of the pedical in AP tracterory. Biplanar projections were used to confirm position. Aspiration was confirmed to be negative for CSF and/or blood. A 1-2 ml. volume of Isovue-250 was injected and flow of contrast was noted at each level. Radiographs were obtained for documentation purposes.   After attaining the desired flow of contrast documented above, a 0.5 to 1.0 ml test dose of 0.25% Marcaine was injected into each respective transforaminal space.  The patient was observed for 90 seconds post injection.  After no sensory deficits were reported, and normal lower extremity motor function was noted,   the above injectate  was administered so that equal amounts of the injectate were placed at each foramen (level) into the transforaminal epidural space.   Additional Comments:  No complications occurred Dressing: 2 x 2 sterile gauze and Band-Aid    Post-procedure details: Patient was observed during the  procedure. Post-procedure instructions were reviewed.  Patient left the clinic in stable condition.    Clinical History: No specialty comments available.     Objective:  VS:  HT:    WT:   BMI:     BP:(!) 142/82  HR:89bpm  TEMP: ( )  RESP:  Physical Exam Vitals and nursing note reviewed.  Constitutional:      General: He is not in acute distress.    Appearance: Normal appearance. He is not ill-appearing.  HENT:     Head: Normocephalic and atraumatic.     Right Ear: External ear normal.     Left Ear: External ear normal.     Nose: No congestion.  Eyes:     Extraocular Movements: Extraocular movements intact.  Cardiovascular:     Rate and Rhythm: Normal rate.     Pulses: Normal pulses.  Pulmonary:     Effort: Pulmonary effort is normal. No respiratory distress.  Abdominal:     General: There is no distension.     Palpations: Abdomen is soft.  Musculoskeletal:        General: No tenderness or signs of injury.     Cervical back: Neck supple.     Right lower leg: No edema.     Left lower leg: No edema.     Comments: Patient has good distal strength without clonus.  Skin:    Findings: No erythema or rash.  Neurological:     General: No focal deficit present.     Mental Status: He is alert and oriented to person, place, and time.     Sensory: No sensory deficit.     Motor: No weakness or abnormal muscle tone.     Coordination: Coordination normal.  Psychiatric:        Mood and Affect: Mood normal.        Behavior: Behavior normal.      Imaging: XR C-ARM NO REPORT  Result Date: 07/20/2022 Please see Notes tab for imaging impression.

## 2022-07-21 NOTE — Procedures (Signed)
Lumbosacral Transforaminal Epidural Steroid Injection - Sub-Pedicular Approach with Fluoroscopic Guidance  Patient: Ronald [name]      Date of Birth: 27-Mar-1950 MRN: [id number] PCP: Elias Else, MD (Inactive)      Visit Date: 07/20/2022   Universal Protocol:    Date/Time: 07/20/2022  Consent Given By: the patient  Position: PRONE  Additional Comments: Vital signs were monitored before and after the procedure. Patient was prepped and draped in the usual sterile fashion. The correct patient, procedure, and site was verified.   Injection Procedure Details:   Procedure diagnoses: Lumbar radiculopathy [M54.16]    Meds Administered:  Meds ordered this encounter  Medications   methylPREDNISolone acetate (DEPO-MEDROL) injection 80 mg    Laterality: Bilateral  Location/Site: L5  Needle:5.0 in., 22 ga.  Short bevel or Quincke spinal needle  Needle Placement: Transforaminal  Findings:    -Comments: Excellent flow of contrast along the nerve, nerve root and into the epidural space.  Procedure Details: After squaring off the end-plates to get a true AP view, the C-arm was positioned so that an oblique view of the foramen as noted above was visualized. The target area is just inferior to the "nose of the scotty dog" or sub pedicular. The soft tissues overlying this structure were infiltrated with 2-3 ml. of 1% Lidocaine without Epinephrine.  The spinal needle was inserted toward the target using a "trajectory" view along the fluoroscope beam.  Under AP and lateral visualization, the needle was advanced so it did not puncture dura and was located close the 6 O'Clock position of the pedical in AP tracterory. Biplanar projections were used to confirm position. Aspiration was confirmed to be negative for CSF and/or blood. A 1-2 ml. volume of Isovue-250 was injected and flow of contrast was noted at each level. Radiographs were obtained for documentation purposes.   After attaining  the desired flow of contrast documented above, a 0.5 to 1.0 ml test dose of 0.25% Marcaine was injected into each respective transforaminal space.  The patient was observed for 90 seconds post injection.  After no sensory deficits were reported, and normal lower extremity motor function was noted,   the above injectate was administered so that equal amounts of the injectate were placed at each foramen (level) into the transforaminal epidural space.   Additional Comments:  No complications occurred Dressing: 2 x 2 sterile gauze and Band-Aid    Post-procedure details: Patient was observed during the procedure. Post-procedure instructions were reviewed.  Patient left the clinic in stable condition.

## 2022-08-03 ENCOUNTER — Ambulatory Visit (INDEPENDENT_AMBULATORY_CARE_PROVIDER_SITE_OTHER): Payer: BC Managed Care – PPO | Admitting: Orthopedic Surgery

## 2022-08-03 DIAGNOSIS — M5416 Radiculopathy, lumbar region: Secondary | ICD-10-CM | POA: Diagnosis not present

## 2022-08-03 NOTE — Progress Notes (Signed)
Orthopedic Spine Surgery Office Note  Assessment: Patient is a 72 y.o. male with low back pain that radiates into bilateral lower extremities.  Feels it along the anterolateral aspect of the thigh into the lateral leg.  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 Had improvement with lumbar injection   Plan: -Patient has tried Tylenol, gabapentin, chiropractor, lumbar injections -I told him at this point, he has tried several conservative treatments and has not gotten any lasting relief so surgery would be an option.  Has oral option would be pain management.  He was leaning towards surgical intervention.  I discussed his options would be L5/S1 ALIF for indirect decompression with L4-S1 decompression and posterior fusion or decompression alone.  I went over the risks and benefits of these procedures.  After this discussion, he wanted to proceed with decompression alone.  However, he wanted to bring his wife in to go over it again. -Patient should return to office in 4 weeks, x-rays at next visit: None   Patient expressed understanding of the plan and all questions were answered to the patient's satisfaction.   ___________________________________________________________________________  History: Patient is a 72 y.o. male who has been previously seen in the office for low back pain that radiates into the bilateral lower extremities.  Pain is felt radiating along the anterolateral aspect of the thighs going into the lateral aspect of the legs.  It then radiates into the dorsum of the foot.  He feels that the pain is worse with any activity.  After last visit, he got bilateral lumbar steroid injections.  He felt he got significant relief on the right side but did not get as significant relief on the left side.  Today, he still has pain on the left side but does not have any pain rating down his right lower extremity.  Previous treatments: Tylenol, gabapentin,  chiropractor, lumbar injections  Physical Exam:  General: no acute distress, appears stated age Neurologic: alert, answering questions appropriately, following commands Respiratory: unlabored breathing on room air, symmetric chest rise Psychiatric: appropriate affect, normal cadence to speech   MSK (spine):  -Strength exam      Left  Right EHL    5/5  5/5 TA    5/5  5/5 GSC    5/5  5/5 Knee extension  5/5  5/5 Hip flexion   5/5  5/5  -Sensory exam    Sensation intact to light touch in L3-S1 nerve distributions of bilateral lower extremities  -Achilles DTR: 2/4 on the left, 2/4 on the right -Patellar tendon DTR: 2/4 on the left, 2/4 on the right  -Straight leg raise: Negative bilaterally -Femoral nerve stretch test: Negative bilaterally -Clonus: no beats bilaterally  Imaging: XR of the lumbar spine from 03/31/2022 and 07/08/2022 was previously independently reviewed and interpreted, showing osteophyte formation at L3/4 and L4/5.  No other significant degenerative changes.  No evidence of instability on flexion/extension views.  Osteophyte formation seen on the right hip.  No fracture or dislocation.   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  Bilateral L5/S1 foraminal stenosis.   Patient name: Ronald [name] Patient MRN: [id number] Date of visit: 08/03/22

## 2022-08-05 ENCOUNTER — Other Ambulatory Visit: Payer: Self-pay | Admitting: Orthopaedic Surgery

## 2022-08-06 ENCOUNTER — Telehealth: Payer: Self-pay | Admitting: Orthopedic Surgery

## 2022-08-06 NOTE — Telephone Encounter (Signed)
I called and spoke with wife she insisted that they come in sooner and discuss surgery further so I scheduled her 815am on 08/07/22

## 2022-08-06 NOTE — Telephone Encounter (Signed)
Patient's wife called. Says he is in a lot of pain. Would like to know what to do?

## 2022-08-07 ENCOUNTER — Ambulatory Visit (INDEPENDENT_AMBULATORY_CARE_PROVIDER_SITE_OTHER): Payer: BC Managed Care – PPO | Admitting: Orthopedic Surgery

## 2022-08-07 VITALS — BP 149/78 | HR 91 | Ht 64.0 in | Wt 156.2 lb

## 2022-08-07 DIAGNOSIS — M5416 Radiculopathy, lumbar region: Secondary | ICD-10-CM

## 2022-08-07 NOTE — Progress Notes (Signed)
Pre-operative Scores  ODI: 20 VAS back: 8 VAS leg: 9 SF-36:   -Physical functioning: 60  -Role limitations due to physical health: 50  -Role limitations due to emotional problems: 0  -Energy/fatigue: 40  -Emotional well-being: 33  -Social functioning: 75  -Pain: 35  -General health: 33   London Sheer, MD Orthopedic Surgeon

## 2022-08-07 NOTE — Progress Notes (Signed)
Orthopedic Spine Surgery Office Note  Assessment: Patient is a 72 y.o. male with low back pain that radiates into bilateral lower extremities.  Feels it along the lateral aspect of the thigh and leg and goes into the dorsum of the foot.  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   Plan: -Patient has tried conservative treatments for several months now without any lasting relief.  Pain is gone to the point that he is unable to work.  Accordingly, discussed operative management as an option for him.  Discussed the pros and cons of indirect and direct decompression.  After this conversation, patient wanted to proceed with direct decompression.  Recommended an L4, L5, S1 segment laminectomies and foraminotomies.  Patient wanted to proceed with that surgery -Patient will next be seen at the date of surgery   The patient has symptoms consistent with lumbar radiculopathy. The patient's symptoms were not improving with conservative treatment so operative management was discussed in the form of L4-S1 laminectomy and bilateral foraminotomies. The risks including but not limited to iatrogenic instability, dural tear, nerve root injury, paralysis, persistent pain, infection, bleeding, heart attack, death, stroke, fracture, and need for additional procedures were discussed with the patient. The benefit of the surgery would be improvement in the patient's radiating leg pain. I explained that back pain relief is not the goal of the surgery and it is not reliably alleviated with this surgery. The alternatives to surgical management were covered with the patient and included continued monitoring, physical therapy, over-the-counter pain medications, ambulatory aids, repeat injections, and activity modification. All the patient's questions were answered to his satisfaction. After this discussion, the patient expressed understanding and elected to proceed with surgical intervention.      ___________________________________________________________________________  History: Patient is a 72 y.o. male who has been previously seen in the office for symptoms consistent with lumbar radiculopathy.  Patient comes in today because his pain has gotten worse and he wanted to talk further about surgery.  He is feeling pain in his low back radiating into the bilateral lower extremities.  He feels that goes into the bilateral buttocks and then goes into the lateral aspect of his thighs and legs to the dorsum of the foot.  His pain improved significantly after an injection on the right side.  His pain improved but only slightly on the left side with the injection.  He feels pain on a daily basis.  He feels pain with activity and at rest.  He said he is having trouble working because of the pain.  Denies paresthesias and numbness.  Previous treatments: Tylenol, gabapentin, chiropractor, lumbar injections  Physical Exam:  General: no acute distress, appears stated age Neurologic: alert, answering questions appropriately, following commands Respiratory: unlabored breathing on room air, symmetric chest rise Psychiatric: appropriate affect, normal cadence to speech   MSK (spine):  -Strength exam      Left  Right EHL    5/5  5/5 TA    5/5  5/5 GSC    5/5  5/5 Knee extension  5/5  5/5 Hip flexion   5/5  5/5  -Sensory exam    Sensation intact to light touch in L3-S1 nerve distributions of bilateral lower extremities  -Achilles DTR: 2/4 on the left, 2/4 on the right -Patellar tendon DTR: 2/4 on the left, 2/4 on the right  -Straight leg raise: positive on the left, negative on the right -Femoral nerve stretch test: negative bilaterally -Clonus: no beats  bilaterally -Palpable DP pulses bilaterally  Imaging: XR of the lumbar spine from 03/31/2022 and 07/08/2022 was previously independently reviewed and interpreted, showing osteophyte formation at L3/4 and L4/5.  No other significant  degenerative changes.  No evidence of instability on flexion/extension views.  Osteophyte formation seen on the right hip.  No fracture or dislocation.   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  Bilateral L5/S1 foraminal stenosis.   Patient name: Ronald [name] Patient MRN: [id number] Date of visit: 08/07/22

## 2022-08-10 ENCOUNTER — Telehealth: Payer: Self-pay

## 2022-08-10 NOTE — Telephone Encounter (Signed)
   Pre-operative Risk Assessment    Patient Name: Ronald [name]  DOB: Feb 08, 1951 MRN: [id number]      Request for Surgical Clearance    Procedure:   L4-L5 AND L5-6,  LAMINECTOMY AND FORAMINOTOMIES   Date of Surgery:  Clearance TBD                                 Surgeon:  DR. Willia Craze Surgeon's Group or Practice Name:  [facility] Phone number:  [phone] Fax number:  [phone] ATTN: APRIL   Type of Clearance Requested:   - Medical    Type of Anesthesia:  General    Additional requests/questions:    Ronald [name]   08/10/2022, 2:51 PM

## 2022-08-11 NOTE — Telephone Encounter (Signed)
   Name: Ronald [name]  DOB: Sep 18, 1950  MRN: [id number]   Primary Cardiologist: Kristeen Miss, MD  Chart reviewed as part of pre-operative protocol coverage. Patient was contacted 08/11/2022 in reference to pre-operative risk assessment for pending surgery as outlined below.  Ronald [name] was last seen on 06/22/2022 by Dr. Elease Hashimoto.  Since that day, Ronald [name] has done well from a cardiac standpoint.  Echocardiogram was stable.  He denies any new symptoms or concerns.  He is able to complete greater than 4 METS without difficulty..  Therefore, based on ACC/AHA guidelines, the patient would be at acceptable risk for the planned procedure without further cardiovascular testing.   The patient was advised that if he develops new symptoms prior to surgery to contact our office to arrange for a follow-up visit, and he verbalized understanding.  I will route this recommendation to the requesting party via Epic fax function and remove from pre-op pool. Please call with questions.  Joylene Grapes, NP 08/11/2022, 9:19 AM

## 2022-08-19 ENCOUNTER — Telehealth: Payer: Self-pay | Admitting: Orthopedic Surgery

## 2022-08-19 NOTE — Telephone Encounter (Signed)
Patient has been scheduled for surgery on 09/04/22. Patient needs a work note stating he will be out from 09/04/22 to (4-6 weeks post surgery). Patient will pick note up once available.

## 2022-08-20 ENCOUNTER — Encounter: Payer: Self-pay | Admitting: Radiology

## 2022-08-20 NOTE — Telephone Encounter (Signed)
Lmom that his note is ready and it is at the desk to the left as he comes in the front door

## 2022-08-25 NOTE — Progress Notes (Signed)
     Your surgery and Pre-Admission testing visit will be at [facility] located at [address], [place], [place] [address].  Please let all your doctors (i.e., Primary Care Physician, Cardiologist, Endocrinologist, Pulmonologist) know you are having surgery. You may need clearance for surgery. If you are on blood thinners, notify your surgeon and ask the doctor who prescribed them how long to hold them before surgery.  If you have had a heart test, such as an EKG, stress test, heart ultrasound, etc., or lab work performed outside of [place], please bring copies of these tests to your Pre-Admission testing, if possible.  These departments may contact you before the day of surgery:  Pre-Service Center - insurance/ billing: [phone] Pharmacy- to review your medications: [phone] Pre-Admission Testing- to set an appointment for your visit: [phone]  (Often, these numbers show up as "SPAM" on your phone)  The Pre-Admission Testing (PAT) visit focuses on Anesthesia for your upcoming surgery.  You do NOT need to fast; take your medications as usual. Please arrive 30 minutes early to allow for parking and admitting.  The visit may last up to an hour. Bring a photo ID and medical insurance card. Reschedule if you are sick. ([phone]) and please, NO children under age 16 at the visit.  During the PAT visit:  We will review your medical and surgical history.   You will receive pre-operative instructions, including the time of arrival at the hospital and surgical start time.  We will review what medication(s) you can take on the day of surgery.  After speaking with the nurse, you will have blood drawn and, if needed, a chest x-ray and EKG.  Most lab results from your doctor are good for 30 days, Hemoglobin A1C is good for 60 days. If you cannot talk to the Pharmacy, bring your medications or a list of them to the PST visit.   Infection control for the [facility] requires: All fingernail and toenail products should be removed before the day of surgery.  (SNS, Acrylic, Gel, Polish, Stickers, Press on, and Poly gel nails.)   Parking information:  Address:  Hospital - [address], [place], [place] [address]  Please look for signs for entrance A off of [address]. Free valet parking is available Monday-Friday 05:30am-06:00pm     

## 2022-08-27 NOTE — Progress Notes (Signed)
Surgical Instructions   Your procedure is scheduled on Friday, 09/04/22. Report to [facility] Main Entrance "A" at 10:15 A.M., then check in with the Admitting office. Any questions or running late day of surgery: call [phone]  Questions prior to your surgery date: call [phone], Monday-Friday, 8am-4pm. If you experience any cold or flu symptoms such as cough, fever, chills, shortness of breath, etc. between now and your scheduled surgery, please notify us at the above number.     Remember:  Do not eat after midnight the night before your surgery  You may drink clear liquids until 9:15am the morning of your surgery.   Clear liquids allowed are: Water, Non-Citrus Juices (without pulp), Carbonated Beverages, Clear Tea, Black Coffee Only (NO MILK, CREAM OR POWDERED CREAMER of any kind), and Gatorade.  Patient Instructions  The night before surgery:  No food after midnight. ONLY clear liquids after midnight   The day of surgery (if you have diabetes): Drink ONE (1) 12 oz G2 given to you in your pre admission testing appointment by 9:15am the morning of surgery. Drink in one sitting. Do not sip.  This drink was given to you during your hospital  pre-op appointment visit.  Nothing else to drink after completing the  12 oz bottle of G2.         If you have questions, please contact your surgeon's office.     Take these medicines the morning of surgery with A SIP OF WATER  amLODipine (NORVASC)  atorvastatin (LIPITOR)  gabapentin (NEURONTIN)  metoprolol succinate (TOPROL XL)  tamsulosin [facility])    May take these medicines IF NEEDED:     WHAT DO I DO ABOUT MY DIABETES MEDICATION?   Do not take oral diabetes medicines (pills) the morning of surgery.  Do not take JARDIANCE 72 hours prior to surgery. Your last dose will be 08/31/22.  Do not take OZEMPIC one week prior to surgery. Do not take a dose after 08/27/22.  THE NIGHT BEFORE SURGERY, do not take glimepiride  (AMARYL) or glipiZIDE (GLUCOTROL XL)        THE MORNING OF SURGERY, do not take glimepiride (AMARYL), glipiZIDE (GLUCOTROL XL) or metFORMIN (GLUCOPHAGE-XR).  The day of surgery, do not take other diabetes injectables, including Byetta (exenatide), Bydureon (exenatide ER), Victoza (liraglutide), or Trulicity (dulaglutide).  If your CBG is greater than 220 mg/dL, you may take  of your sliding scale (correction) dose of insulin.   HOW TO MANAGE YOUR DIABETES BEFORE AND AFTER SURGERY  Why is it important to control my blood sugar before and after surgery? Improving blood sugar levels before and after surgery helps healing and can limit problems. A way of improving blood sugar control is eating a healthy diet by:  Eating less sugar and carbohydrates  Increasing activity/exercise  Talking with your doctor about reaching your blood sugar goals High blood sugars (greater than 180 mg/dL) can raise your risk of infections and slow your recovery, so you will need to focus on controlling your diabetes during the weeks before surgery. Make sure that the doctor who takes care of your diabetes knows about your planned surgery including the date and location.  How do I manage my blood sugar before surgery? Check your blood sugar at least 4 times a day, starting 2 days before surgery, to make sure that the level is not too high or low.  Check your blood sugar the morning of your surgery when you wake up and every 2 hours until you  get to the Short Stay unit.  If your blood sugar is less than 70 mg/dL, you will need to treat for low blood sugar: Do not take insulin. Treat a low blood sugar (less than 70 mg/dL) with  cup of clear juice (cranberry or apple), 4 glucose tablets, OR glucose gel. Recheck blood sugar in 15 minutes after treatment (to make sure it is greater than 70 mg/dL). If your blood sugar is not greater than 70 mg/dL on recheck, call [phone] for further instructions. Report your  blood sugar to the short stay nurse when you get to Short Stay.  If you are admitted to the hospital after surgery: Your blood sugar will be checked by the staff and you will probably be given insulin after surgery (instead of oral diabetes medicines) to make sure you have good blood sugar levels. The goal for blood sugar control after surgery is 80-180 mg/dL.  One week prior to surgery, STOP taking any Aspirin (unless otherwise instructed by your surgeon) Aleve, Naproxen, Ibuprofen, Motrin, Advil, Goody's, BC's, all herbal medications, fish oil, and non-prescription vitamins. This includes meloxicam (MOBIC).                      Do NOT Smoke (Tobacco/Vaping) for 24 hours prior to your procedure.  If you use a CPAP at night, you may bring your mask/headgear for your overnight stay.   You will be asked to remove any contacts, glasses, piercing's, hearing aid's, dentures/partials prior to surgery. Please bring cases for these items if needed.    Patients discharged the day of surgery will not be allowed to drive home, and someone needs to stay with them for 24 hours.  SURGICAL WAITING ROOM VISITATION Patients may have no more than 2 support people in the waiting area - these visitors may rotate.   Pre-op nurse will coordinate an appropriate time for 1 ADULT support person, who may not rotate, to accompany patient in pre-op.  Children under the age of 84 must have an adult with them who is not the patient and must remain in the main waiting area with an adult.  If the patient needs to stay at the hospital during part of their recovery, the visitor guidelines for inpatient rooms apply.  Please refer to the [facility] website for the visitor guidelines for any additional information.   If you received a COVID test during your pre-op visit  it is requested that you wear a mask when out in public, stay away from anyone that may not be feeling well and notify your surgeon if you develop symptoms.  If you have been in contact with anyone that has tested positive in the last 10 days please notify you surgeon.      Pre-operative 5 CHG Bath Instructions   You can play a key role in reducing the risk of infection after surgery. Your skin needs to be as free of germs as possible. You can reduce the number of germs on your skin by washing with CHG (chlorhexidine gluconate) soap before surgery. CHG is an antiseptic soap that kills germs and continues to kill germs even after washing.   DO NOT use if you have an allergy to chlorhexidine/CHG or antibacterial soaps. If your skin becomes reddened or irritated, stop using the CHG and notify one of our RNs at [phone].   Please shower with the CHG soap starting 4 days before surgery using the following schedule:     Please keep in  mind the following:  DO NOT shave, including legs and underarms, starting the day of your first shower.   You may shave your face at any point before/day of surgery.  Place clean sheets on your bed the day you start using CHG soap. Use a clean washcloth (not used since being washed) for each shower. DO NOT sleep with pets once you start using the CHG.   CHG Shower Instructions:  If you choose to wash your hair and private area, wash first with your normal shampoo/soap.  After you use shampoo/soap, rinse your hair and body thoroughly to remove shampoo/soap residue.  Turn the water OFF and apply about 3 tablespoons (45 ml) of CHG soap to a CLEAN washcloth.  Apply CHG soap ONLY FROM YOUR NECK DOWN TO YOUR TOES (washing for 3-5 minutes)  DO NOT use CHG soap on face, private areas, open wounds, or sores.  Pay special attention to the area where your surgery is being performed.  If you are having back surgery, having someone wash your back for you may be helpful. Wait 2 minutes after CHG soap is applied, then you may rinse off the CHG soap.  Pat dry with a clean towel  Put on clean clothes/pajamas   If you choose to  wear lotion, please use ONLY the CHG-compatible lotions on the back of this paper.   Additional instructions for the day of surgery: DO NOT APPLY any lotions, deodorants, cologne, or perfumes.   Do not bring valuables to the hospital. [facility] is not responsible for any belongings/valuables. Do not wear nail polish, gel polish, artificial nails, or any other type of covering on natural nails (fingers and toes) Do not wear jewelry or makeup Put on clean/comfortable clothes.  Please brush your teeth.  Ask your nurse before applying any prescription medications to the skin.     CHG Compatible Lotions   Aveeno Moisturizing lotion  Cetaphil Moisturizing Cream  Cetaphil Moisturizing Lotion  Clairol Herbal Essence Moisturizing Lotion, Dry Skin  Clairol Herbal Essence Moisturizing Lotion, Extra Dry Skin  Clairol Herbal Essence Moisturizing Lotion, Normal Skin  Curel Age Defying Therapeutic Moisturizing Lotion with Alpha Hydroxy  Curel Extreme Care Body Lotion  Curel Soothing Hands Moisturizing Hand Lotion  Curel Therapeutic Moisturizing Cream, Fragrance-Free  Curel Therapeutic Moisturizing Lotion, Fragrance-Free  Curel Therapeutic Moisturizing Lotion, Original Formula  Eucerin Daily Replenishing Lotion  Eucerin Dry Skin Therapy Plus Alpha Hydroxy Crme  Eucerin Dry Skin Therapy Plus Alpha Hydroxy Lotion  Eucerin Original Crme  Eucerin Original Lotion  Eucerin Plus Crme Eucerin Plus Lotion  Eucerin TriLipid Replenishing Lotion  Keri Anti-Bacterial Hand Lotion  Keri Deep Conditioning Original Lotion Dry Skin Formula Softly Scented  Keri Deep Conditioning Original Lotion, Fragrance Free Sensitive Skin Formula  Keri Lotion Fast Absorbing Fragrance Free Sensitive Skin Formula  Keri Lotion Fast Absorbing Softly Scented Dry Skin Formula  Keri Original Lotion  Keri Skin Renewal Lotion Keri Silky Smooth Lotion  Keri Silky Smooth Sensitive Skin Lotion  Nivea Body Creamy Conditioning  Oil  Nivea Body Extra Enriched Lotion  Nivea Body Original Lotion  Nivea Body Sheer Moisturizing Lotion Nivea Crme  Nivea Skin Firming Lotion  NutraDerm 30 Skin Lotion  NutraDerm Skin Lotion  NutraDerm Therapeutic Skin Cream  NutraDerm Therapeutic Skin Lotion  ProShield Protective Hand Cream  Provon moisturizing lotion  Please read over the following fact sheets that you were given.

## 2022-08-28 ENCOUNTER — Other Ambulatory Visit: Payer: Self-pay

## 2022-08-28 ENCOUNTER — Encounter (HOSPITAL_COMMUNITY)
Admission: RE | Admit: 2022-08-28 | Discharge: 2022-08-28 | Disposition: A | Discharge status: Home / Self Care | Payer: BC Managed Care – PPO | Source: Ambulatory Visit | Attending: Orthopedic Surgery | Admitting: Orthopedic Surgery

## 2022-08-28 ENCOUNTER — Inpatient Hospital Stay (HOSPITAL_COMMUNITY)
Admission: RE | Admit: 2022-08-28 | Discharge: 2022-08-28 | Disposition: A | Discharge status: Home / Self Care | Payer: BC Managed Care – PPO | Source: Ambulatory Visit

## 2022-08-28 ENCOUNTER — Encounter (HOSPITAL_COMMUNITY): Payer: Self-pay

## 2022-08-28 VITALS — BP 130/68 | HR 92 | Temp 98.2°F | Resp 18 | Ht 64.0 in | Wt 159.0 lb

## 2022-08-28 DIAGNOSIS — Z01812 Encounter for preprocedural laboratory examination: Secondary | ICD-10-CM | POA: Insufficient documentation

## 2022-08-28 DIAGNOSIS — Z01818 Encounter for other preprocedural examination: Secondary | ICD-10-CM

## 2022-08-28 DIAGNOSIS — N183 Chronic kidney disease, stage 3 unspecified: Secondary | ICD-10-CM | POA: Insufficient documentation

## 2022-08-28 DIAGNOSIS — E1142 Type 2 diabetes mellitus with diabetic polyneuropathy: Secondary | ICD-10-CM | POA: Diagnosis not present

## 2022-08-28 DIAGNOSIS — Z85828 Personal history of other malignant neoplasm of skin: Secondary | ICD-10-CM | POA: Diagnosis not present

## 2022-08-28 DIAGNOSIS — Z7984 Long term (current) use of oral hypoglycemic drugs: Secondary | ICD-10-CM | POA: Insufficient documentation

## 2022-08-28 DIAGNOSIS — M5416 Radiculopathy, lumbar region: Secondary | ICD-10-CM | POA: Insufficient documentation

## 2022-08-28 DIAGNOSIS — I129 Hypertensive chronic kidney disease with stage 1 through stage 4 chronic kidney disease, or unspecified chronic kidney disease: Secondary | ICD-10-CM | POA: Insufficient documentation

## 2022-08-28 DIAGNOSIS — E1122 Type 2 diabetes mellitus with diabetic chronic kidney disease: Secondary | ICD-10-CM | POA: Diagnosis not present

## 2022-08-28 HISTORY — DX: Unspecified malignant neoplasm of skin, unspecified: C44.90

## 2022-08-28 LAB — BASIC METABOLIC PANEL
Anion gap: 9 (ref 5–15)
BUN: 18 mg/dL (ref 8–23)
CO2: 29 mmol/L (ref 22–32)
Calcium: 9.5 mg/dL (ref 8.9–10.3)
Chloride: 100 mmol/L (ref 98–111)
Creatinine, Ser: 1.26 mg/dL — ABNORMAL HIGH (ref 0.61–1.24)
GFR, Estimated: >60 mL/min (ref >60)
Glucose, Bld: 146 mg/dL — ABNORMAL HIGH (ref 70–99)
Potassium: 4.4 mmol/L (ref 3.5–5.1)
Sodium: 138 mmol/L (ref 135–145)

## 2022-08-28 LAB — CBC
HCT: 54.1 % — ABNORMAL HIGH (ref 39.0–52.0)
Hemoglobin: 18.2 g/dL — ABNORMAL HIGH (ref 13.0–17.0)
MCH: 30.8 pg (ref 26.0–34.0)
MCHC: 33.6 g/dL (ref 30.0–36.0)
MCV: 91.7 fL (ref 80.0–100.0)
Platelets: 225 10*3/uL (ref 150–400)
RBC: 5.9 MIL/uL — ABNORMAL HIGH (ref 4.22–5.81)
RDW: 12.5 % (ref 11.5–15.5)
WBC: 7.3 10*3/uL (ref 4.0–10.5)
nRBC: 0 % (ref 0.0–0.2)

## 2022-08-28 LAB — SURGICAL PCR SCREEN
MRSA, PCR: NEGATIVE
Staphylococcus aureus: NEGATIVE

## 2022-08-28 LAB — GLUCOSE, CAPILLARY: Glucose-Capillary: 151 mg/dL — ABNORMAL HIGH (ref 70–99)

## 2022-08-28 NOTE — Progress Notes (Signed)
PCP - Fleet Contras hagler Cardiologist - Dr Elease Hashimoto  PPM/ICD - denies   Chest x-ray -N/A  EKG - 06/22/22 Stress Test - denies ECHO - 07/16/22 Cardiac Cath - denies  Sleep Study - N/A   Fasting Blood Sugar - CBG 151 today. Pt states that he has not checked his blood sugar at home in months. He states that he is going to start checking. Pt reports last A1c was on 08/13/22 and was 8.0. Records requested for last office note and A1c result.   Last dose of GLP1 agonist-  Ozempic- 08/21/22 GLP1 instructions: Pt to hold 08/28/22 dose.   Blood Thinner Instructions: N/A Aspirin Instructions:N/A  ERAS Protcol - ERAS + G2   COVID TEST- N/A  Pt instructed to call pharmacy call center to review medications by phone.   Anesthesia review: review cardiac clearance, follow up on requested records. (PCP note, A1c)  Patient denies shortness of breath, fever, cough and chest pain at PAT appointment   All instructions explained to the patient, with a verbal understanding of the material. Patient agrees to go over the instructions while at home for a better understanding. Patient also instructed to self quarantine after being tested for COVID-19. The opportunity to ask questions was provided.

## 2022-08-31 ENCOUNTER — Encounter (HOSPITAL_COMMUNITY): Payer: Self-pay | Admitting: Vascular Surgery

## 2022-08-31 ENCOUNTER — Encounter (HOSPITAL_COMMUNITY): Payer: Self-pay

## 2022-08-31 ENCOUNTER — Ambulatory Visit: Payer: BC Managed Care – PPO | Admitting: Orthopedic Surgery

## 2022-08-31 NOTE — Progress Notes (Signed)
Anesthesia Chart Review:  Case: [id number] Date/Time: 09/04/22 1200   Procedure: L4-5, L5-S1 LAMINECTOMY AND FORAMINOTOMIES   Anesthesia type: General   Pre-op diagnosis: LUMBAR RADICULOPATHY   Location: [facility] OR ROOM 09 / [facility] OR   Surgeons: London Sheer, MD       DISCUSSION: Patient is a 72 year old Ronald [name] scheduled for the above procedure.  History includes never smoker, HTN, DM2 (with neuropathy, retinopathy), GERD, murmur (mild AS with mean gradient 11.4 mmHg, AVA 1.13 1.13 cm, trivial MR 07/16/22), HLD, thrombocytosis, primary hyperparathyroidism (s/p right inferior parathyroidectomy 05/26/07), skin cancer excision, BPH.   Evaluation by cardiologist Dr. Elease Ronald [name] on 06/22/22 for HTN, murmur and mild abdominal aorta ectasia (on 2019 [place]). 07/16/22 echo showed normal LVEF, grade 1 diastolic dysfunction, trivial MR, mild AS. Toprol added for HTN and tachycardia. Six month follow-up planned. Preoperative cardiology input outlined by Bernadene Person, NP on 08/11/22, "Patient was contacted 08/11/2022 in reference to pre-operative risk assessment for pending surgery as outlined below.  Ronald Ronald [name] was last seen on 06/22/2022 by Dr. Elease Ronald [name].  Since that day, Ronald Ronald [name] has done well from a cardiac standpoint.  Echocardiogram was stable.  He denies any new symptoms or concerns.  He is able to complete greater than 4 METS without difficulty..   Therefore, based on ACC/AHA guidelines, the patient would be at acceptable risk for the planned procedure without further cardiovascular testing."   He had medical clearance following visit with Ronald Goodpasture, NP at [facility]. Labs on 08/13/22 showed A1c 8.0%,glucose 182, Cr 1.08, AST 20, ALT 26, WBC Ronald.0, H/H 16.8/50.4, PLT 233K. She advised close monitoring of glucose with post-operative follow-up in 1-2 months. She advised holding Jardiance for 3 days prior and Ozempic for 1 week prior. At PAT these instructions were also reviewed. Last Ozempic 08/21/22 and  instructed at PAT to hold 6/Ronald/24 dose. PAT H/H 18.2/54.1. I routed A1c result and H/H results to Dr. Christell Ronald [name].   Anesthesia team to evaluate on the day of surgery. He will get a CBG on arrival.    VS: BP 130/68   Pulse 92   Temp 36.8 C (Oral)   Resp 18   Ht 5\' 4"  (1.626 m)   Wt 72.1 kg   SpO2 98%   BMI 27.29 kg/m    PROVIDERS: Aliene Beams, MD is PCP  Kristeen Miss, MD is cardiologist   LABS: Preoperative labs noted. H/H 18.2/54.1, up from 16.8/50.4 on 08/13/22 [facility]).  (all labs ordered are listed, but only abnormal results are displayed)  Labs Reviewed  GLUCOSE, CAPILLARY - Abnormal; Notable for the following components:      Result Value   Glucose-Capillary 151 (*)    All other components within normal limits  CBC - Abnormal; Notable for the following components:   RBC 5.90 (*)    Hemoglobin 18.2 (*)    HCT 54.1 (*)    All other components within normal limits  BASIC METABOLIC PANEL - Abnormal; Notable for the following components:   Glucose, Bld 146 (*)    Creatinine, Ser 1.26 (*)    All other components within normal limits  SURGICAL PCR SCREEN    IMAGES: MRI L-spine 05/16/22: IMPRESSION: 1. At L5-S1, moderate bilateral foraminal stenosis. 2. At L4-L5, moderate left foraminal stenosis with left far lateral/extraforaminal disc closely approximating the exiting left L4 nerve. Mild canal stenosis. 3. At L3-L4, mild right foraminal stenosis with right far lateral/extraforaminal disc closely approximating the exiting/exited right L3 nerve.  EKG: 06/22/22: NSR. Non-specific T wave abnormality.   CV: Echo 07/16/22: IMPRESSIONS   1. Left ventricular ejection fraction, by estimation, is 60 to 65%. The  left ventricle has normal function. The left ventricle has no regional  wall motion abnormalities. Left ventricular diastolic parameters are  consistent with Grade I diastolic  dysfunction (impaired relaxation).   2. Right ventricular systolic function is  normal. The right ventricular  size is normal.   3. The mitral valve is normal in structure. Trivial mitral valve  regurgitation. No evidence of mitral stenosis.   4. The aortic valve is tricuspid. There is moderate calcification of the  aortic valve. Aortic valve regurgitation is not visualized. Mild aortic  valve stenosis. Aortic valve area, by VTI measures 1.13 cm. Aortic valve  mean gradient measures 11.4 mmHg.  Aortic valve Vmax measures 2.27 m/s.   5. The inferior vena cava is normal in size with greater than 50%  respiratory variability, suggesting right atrial pressure of 3 mmHg.   [place] Abd aorta Ronald/30/19: IMPRESSION: There is mild failure to taper of the mid and distal abdominal aorta without evidence of aneurysm. Mild dilation of the proximal abdominal aorta to 2.6 cm in the transverse plane. Ectatic abdominal aorta at risk for aneurysm development. Recommend followup by ultrasound in 5 years. This recommendation follows ACR consensus guidelines: White Paper of the ACR Incidental Findings Committee II on Vascular Findings. J Am Coll Radiol 2013; 10:789-794.    Past Medical History:  Diagnosis Date   Carpal tunnel syndrome    Diabetes mellitus without complication (HCC)    Diabetic peripheral neuropathy (HCC)    Diabetic renal disease (HCC)    Elevated hemoglobin (HCC)    Esophageal polyp    GERD (gastroesophageal reflux disease)    Heart murmur    HLD (hyperlipidemia)    Hyperglycemia due to type 2 diabetes mellitus (HCC)    Hyperplasia of prostate    Hypertension    Hypertensive retinopathy    Hypogonadism in Ronald [name]    Kidney disease, chronic, stage III (GFR 30-59 ml/min) (HCC)    Rotator cuff tear    Skin cancer    removed from left arm   Thrombocytosis    Trigger finger     Past Surgical History:  Procedure Laterality Date   CARPAL BOSS EXCISION     CARPAL TUNNEL RELEASE Bilateral    CATARACT EXTRACTION W/ INTRAOCULAR LENS IMPLANT Bilateral    KNEE SURGERY  Bilateral    ROTATOR CUFF REPAIR Right    ROTATOR CUFF REPAIR Left    and biceps repair    MEDICATIONS:  acetaminophen (TYLENOL) 650 MG CR tablet   TURMERIC PO   amLODipine (NORVASC) 5 MG tablet   ascorbic acid (VITAMIN C) 500 MG tablet   atorvastatin (LIPITOR) 40 MG tablet   cholecalciferol (VITAMIN D3) 25 MCG (1000 UNIT) tablet   Chromium-Cinnamon (CINNAMON PLUS CHROMIUM PO)   gabapentin (NEURONTIN) 300 MG capsule   glipiZIDE (GLUCOTROL XL) 5 MG 24 hr tablet   hydrochlorothiazide (HYDRODIURIL) 12.5 MG tablet   JARDIANCE 25 MG TABS tablet   losartan (COZAAR) 100 MG tablet   meloxicam (MOBIC) Ronald.5 MG tablet   metFORMIN (GLUCOPHAGE-XR) 500 MG 24 hr tablet   metoprolol succinate (TOPROL XL) 25 MG 24 hr tablet   Multiple Vitamin (MULTIVITAMIN WITH MINERALS) TABS tablet   OZEMPIC, 2 MG/DOSE, 8 MG/3ML SOPN   tamsulosin (FLOMAX) 0.4 MG CAPS capsule   Testosterone 1.62 % GEL   vitamin B-12 (CYANOCOBALAMIN) 1000  MCG tablet   No current facility-administered medications for this encounter.    Shonna Chock, PA-C Surgical Short Stay/Anesthesiology [facility] Phone [phone] [facility] Phone [phone] 08/31/2022 6:40 PM

## 2022-08-31 NOTE — Anesthesia Preprocedure Evaluation (Signed)
Anesthesia Evaluation    Airway        Dental   Pulmonary           Cardiovascular hypertension,      Neuro/Psych    GI/Hepatic   Endo/Other  diabetes    Renal/GU      Musculoskeletal   Abdominal   Peds  Hematology   Anesthesia Other Findings   Reproductive/Obstetrics                             Anesthesia Physical Anesthesia Plan  ASA:   Anesthesia Plan:    Post-op Pain Management:    Induction:   PONV Risk Score and Plan:   Airway Management Planned:   Additional Equipment:   Intra-op Plan:   Post-operative Plan:   Informed Consent:   Plan Discussed with:   Anesthesia Plan Comments: (PAT note written 08/31/2022 by Shonna Chock, PA-C.  )       Anesthesia Quick Evaluation

## 2022-09-04 ENCOUNTER — Encounter (HOSPITAL_COMMUNITY): Admission: RE | Payer: Self-pay | Source: Home / Self Care

## 2022-09-04 ENCOUNTER — Ambulatory Visit (HOSPITAL_COMMUNITY)
Admission: RE | Admit: 2022-09-04 | Payer: BC Managed Care – PPO | Source: Home / Self Care | Admitting: Orthopedic Surgery

## 2022-09-04 DIAGNOSIS — M5416 Radiculopathy, lumbar region: Secondary | ICD-10-CM

## 2022-09-04 SURGERY — DECOMPRESSIVE LUMBAR LAMINECTOMY LEVEL 1
Anesthesia: General

## 2022-09-18 ENCOUNTER — Telehealth: Payer: Self-pay | Admitting: Orthopedic Surgery

## 2022-09-18 NOTE — Telephone Encounter (Signed)
Patient's wife calling wanting to know when Ronald [name] can get back on the schedule.  He was not able to have surgery 09-04-22 but A1-c was 8.0.  please call  [phone]

## 2022-09-21 ENCOUNTER — Encounter: Payer: BC Managed Care – PPO | Admitting: Orthopedic Surgery

## 2022-09-22 NOTE — Telephone Encounter (Signed)
I spoke with the patient on 09/21/22 and advised him to contact his PCP to find out when he can have his A1C redrawn. Once A1C reaches 7.5 or lower he will need to have results sent here for doctor to review. Then he can be put back on the schedule for surgery. All questions were answered, and patient understood.

## 2022-09-23 ENCOUNTER — Telehealth: Payer: Self-pay | Admitting: Orthopedic Surgery

## 2022-09-23 MED ORDER — TRAMADOL HCL 50 MG PO TABS: 50.0000 mg | ORAL_TABLET | Freq: Four times a day (QID) | ORAL | 0 refills | Status: DC | PRN

## 2022-09-23 NOTE — Telephone Encounter (Signed)
I called and they are willing to try the tramadol. Please send to [facility] in [place]

## 2022-09-23 NOTE — Telephone Encounter (Signed)
Patient's wife is requesting a call to discuss if there is anything else doctor could give the patient to help with his pain. Per the wife, patient's pain is almost unbearable, and he is still trying to continue to work. I did advise patient this week of steps he needs to take to get back on the surgery schedule, and patient confirmed he understood. Wife states patient did not mention to her that we spoke, so instructions were relayed to wife as well. Please call patient/ spouse after 4 pm today if possible to speak with both patient and wife.

## 2022-09-27 ENCOUNTER — Other Ambulatory Visit: Payer: Self-pay | Admitting: Orthopedic Surgery

## 2022-10-07 ENCOUNTER — Other Ambulatory Visit: Payer: Self-pay | Admitting: Orthopedic Surgery

## 2022-10-09 ENCOUNTER — Telehealth: Payer: Self-pay | Admitting: Orthopedic Surgery

## 2022-10-09 NOTE — Telephone Encounter (Signed)
Patient's wife called requesting a work note for patient stating when surgery is (August 12), and about how long patient will be out of work post surgery. Patient will pick up letter once available.

## 2022-10-12 ENCOUNTER — Encounter: Payer: Self-pay | Admitting: Radiology

## 2022-10-12 NOTE — Telephone Encounter (Signed)
I called and lmom that note has been written and that they could pick it up at the front desk to the left as they came in the from door.

## 2022-10-22 NOTE — Progress Notes (Signed)
Surgical Instructions   Your procedure is scheduled on Monday, November 02, 2022. Report to [facility] Main Entrance "A" at 5:30 A.M., then check in with the Admitting office. Any questions or running late day of surgery: call [phone]  Questions prior to your surgery date: call [phone], Monday-Friday, 8am-4pm. If you experience any cold or flu symptoms such as cough, fever, chills, shortness of breath, etc. between now and your scheduled surgery, please notify us at the above number.     Remember:  Do not eat after midnight the night before your surgery  You may drink clear liquids until 4:30 the morning of your surgery.   Clear liquids allowed are: Water, Non-Citrus Juices (without pulp), Carbonated Beverages, Clear Tea, Black Coffee Only (NO MILK, CREAM OR POWDERED CREAMER of any kind), and Gatorade.   Patient Instructions  The night before surgery:  No food after midnight. ONLY clear liquids after midnight   The day of surgery (if you have diabetes): Drink ONE (1) 12 oz G2 given to you in your pre admission testing appointment by 4:30 the morning of surgery. Drink in one sitting. Do not sip.  This drink was given to you during your hospital  pre-op appointment visit.  Nothing else to drink after completing the  12 oz bottle of G2.         If you have questions, please contact your surgeon's office.     Take these medicines the morning of surgery with A SIP OF WATER  acetaminophen (TYLENOL)  amLODipine (NORVASC)  gabapentin (NEURONTIN)  metoprolol succinate (TOPROL XL)    May take these medicines IF NEEDED: oxymetazoline (AFRIN)  traMADol (ULTRAM)    One week prior to surgery, STOP taking any Aspirin (unless otherwise instructed by your surgeon) Aleve, Naproxen, Ibuprofen, Motrin, Advil, Goody's, BC's, all herbal medications, fish oil, and non-prescription vitamins. This includes your meloxicam (MOBIC).    WHAT DO I DO ABOUT MY DIABETES MEDICATION?   Do  not take oral diabetes medicines (pills) the morning of surgery.          DO NOT TAKE JARDIANCE 72 hours prior to surgery.  Your last dose will be 10/29/2022.         DO NOT TAKE OZEMPIC one week prior to surgery. Do not take a dose after 10/25/2022.        THE NIGHT BEFORE SURGERY, DO NOT TAKE glipiZIDE (GLUCOTROL XL).          THE MORNING OF SURGERY, DO NOT TAKE glipiZIDE (GLUCOTROL XL) OR metFORMIN (GLUCOPHAGE-XR)   The day of surgery, do not take other diabetes injectables, including Byetta (exenatide), Bydureon (exenatide ER), Victoza (liraglutide), or Trulicity (dulaglutide).  If your CBG is greater than 220 mg/dL, you may take  of your sliding scale (correction) dose of insulin.   HOW TO MANAGE YOUR DIABETES BEFORE AND AFTER SURGERY  Why is it important to control my blood sugar before and after surgery? Improving blood sugar levels before and after surgery helps healing and can limit problems. A way of improving blood sugar control is eating a healthy diet by:  Eating less sugar and carbohydrates  Increasing activity/exercise  Talking with your doctor about reaching your blood sugar goals High blood sugars (greater than 180 mg/dL) can raise your risk of infections and slow your recovery, so you will need to focus on controlling your diabetes during the weeks before surgery. Make sure that the doctor who takes care of your diabetes knows about your planned surgery  including the date and location.  How do I manage my blood sugar before surgery? Check your blood sugar at least 4 times a day, starting 2 days before surgery, to make sure that the level is not too high or low.  Check your blood sugar the morning of your surgery when you wake up and every 2 hours until you get to the Short Stay unit.  If your blood sugar is less than 70 mg/dL, you will need to treat for low blood sugar: Do not take insulin. Treat a low blood sugar (less than 70 mg/dL) with  cup of clear juice  (cranberry or apple), 4 glucose tablets, OR glucose gel. Recheck blood sugar in 15 minutes after treatment (to make sure it is greater than 70 mg/dL). If your blood sugar is not greater than 70 mg/dL on recheck, call [phone] for further instructions. Report your blood sugar to the short stay nurse when you get to Short Stay.  If you are admitted to the hospital after surgery: Your blood sugar will be checked by the staff and you will probably be given insulin after surgery (instead of oral diabetes medicines) to make sure you have good blood sugar levels. The goal for blood sugar control after surgery is 80-180 mg/dL.                      Do NOT Smoke (Tobacco/Vaping) for 24 hours prior to your procedure.  If you use a CPAP at night, you may bring your mask/headgear for your overnight stay.   You will be asked to remove any contacts, glasses, piercing's, hearing aid's, dentures/partials prior to surgery. Please bring cases for these items if needed.    Patients discharged the day of surgery will not be allowed to drive home, and someone needs to stay with them for 24 hours.  SURGICAL WAITING ROOM VISITATION Patients may have no more than 2 support people in the waiting area - these visitors may rotate.   Pre-op nurse will coordinate an appropriate time for 1 ADULT support person, who may not rotate, to accompany patient in pre-op.  Children under the age of 42 must have an adult with them who is not the patient and must remain in the main waiting area with an adult.  If the patient needs to stay at the hospital during part of their recovery, the visitor guidelines for inpatient rooms apply.  Please refer to the [facility] website for the visitor guidelines for any additional information.   If you received a COVID test during your pre-op visit  it is requested that you wear a mask when out in public, stay away from anyone that may not be feeling well and notify your surgeon if you  develop symptoms. If you have been in contact with anyone that has tested positive in the last 10 days please notify you surgeon.      Pre-operative 5 CHG Bathing Instructions   You can play a key role in reducing the risk of infection after surgery. Your skin needs to be as free of germs as possible. You can reduce the number of germs on your skin by washing with CHG (chlorhexidine gluconate) soap before surgery. CHG is an antiseptic soap that kills germs and continues to kill germs even after washing.   DO NOT use if you have an allergy to chlorhexidine/CHG or antibacterial soaps. If your skin becomes reddened or irritated, stop using the CHG and notify one of our  RNs at [phone].   Please shower with the CHG soap starting 4 days before surgery using the following schedule:     Please keep in mind the following:  DO NOT shave, including legs and underarms, starting the day of your first shower.   You may shave your face at any point before/day of surgery.  Place clean sheets on your bed the day you start using CHG soap. Use a clean washcloth (not used since being washed) for each shower. DO NOT sleep with pets once you start using the CHG.   CHG Shower Instructions:  If you choose to wash your hair and private area, wash first with your normal shampoo/soap.  After you use shampoo/soap, rinse your hair and body thoroughly to remove shampoo/soap residue.  Turn the water OFF and apply about 3 tablespoons (45 ml) of CHG soap to a CLEAN washcloth.  Apply CHG soap ONLY FROM YOUR NECK DOWN TO YOUR TOES (washing for 3-5 minutes)  DO NOT use CHG soap on face, private areas, open wounds, or sores.  Pay special attention to the area where your surgery is being performed.  If you are having back surgery, having someone wash your back for you may be helpful. Wait 2 minutes after CHG soap is applied, then you may rinse off the CHG soap.  Pat dry with a clean towel  Put on clean  clothes/pajamas   If you choose to wear lotion, please use ONLY the CHG-compatible lotions on the back of this paper.   Additional instructions for the day of surgery: DO NOT APPLY any lotions, deodorants, cologne, or perfumes.   Do not bring valuables to the hospital. [facility] is not responsible for any belongings/valuables. Do not wear nail polish, gel polish, artificial nails, or any other type of covering on natural nails (fingers and toes) Do not wear jewelry or makeup Put on clean/comfortable clothes.  Please brush your teeth.  Ask your nurse before applying any prescription medications to the skin.     CHG Compatible Lotions   Aveeno Moisturizing lotion  Cetaphil Moisturizing Cream  Cetaphil Moisturizing Lotion  Clairol Herbal Essence Moisturizing Lotion, Dry Skin  Clairol Herbal Essence Moisturizing Lotion, Extra Dry Skin  Clairol Herbal Essence Moisturizing Lotion, Normal Skin  Curel Age Defying Therapeutic Moisturizing Lotion with Alpha Hydroxy  Curel Extreme Care Body Lotion  Curel Soothing Hands Moisturizing Hand Lotion  Curel Therapeutic Moisturizing Cream, Fragrance-Free  Curel Therapeutic Moisturizing Lotion, Fragrance-Free  Curel Therapeutic Moisturizing Lotion, Original Formula  Eucerin Daily Replenishing Lotion  Eucerin Dry Skin Therapy Plus Alpha Hydroxy Crme  Eucerin Dry Skin Therapy Plus Alpha Hydroxy Lotion  Eucerin Original Crme  Eucerin Original Lotion  Eucerin Plus Crme Eucerin Plus Lotion  Eucerin TriLipid Replenishing Lotion  Keri Anti-Bacterial Hand Lotion  Keri Deep Conditioning Original Lotion Dry Skin Formula Softly Scented  Keri Deep Conditioning Original Lotion, Fragrance Free Sensitive Skin Formula  Keri Lotion Fast Absorbing Fragrance Free Sensitive Skin Formula  Keri Lotion Fast Absorbing Softly Scented Dry Skin Formula  Keri Original Lotion  Keri Skin Renewal Lotion Keri Silky Smooth Lotion  Keri Silky Smooth Sensitive Skin  Lotion  Nivea Body Creamy Conditioning Oil  Nivea Body Extra Enriched Teacher, adult education Moisturizing Lotion Nivea Crme  Nivea Skin Firming Lotion  NutraDerm 30 Skin Lotion  NutraDerm Skin Lotion  NutraDerm Therapeutic Skin Cream  NutraDerm Therapeutic Skin Lotion  ProShield Protective Hand Cream  Provon moisturizing lotion  Please read over the following fact sheets that you were given.

## 2022-10-23 ENCOUNTER — Other Ambulatory Visit: Payer: Self-pay

## 2022-10-23 ENCOUNTER — Encounter (HOSPITAL_COMMUNITY)
Admission: RE | Admit: 2022-10-23 | Discharge: 2022-10-23 | Disposition: A | Discharge status: Home / Self Care | Payer: BC Managed Care – PPO | Source: Ambulatory Visit | Attending: Orthopedic Surgery | Admitting: Orthopedic Surgery

## 2022-10-23 ENCOUNTER — Encounter (HOSPITAL_COMMUNITY): Payer: Self-pay

## 2022-10-23 VITALS — BP 119/68 | HR 84 | Temp 97.8°F | Resp 17 | Ht 64.5 in | Wt 151.8 lb

## 2022-10-23 DIAGNOSIS — Z01812 Encounter for preprocedural laboratory examination: Secondary | ICD-10-CM | POA: Diagnosis present

## 2022-10-23 DIAGNOSIS — Z01818 Encounter for other preprocedural examination: Secondary | ICD-10-CM

## 2022-10-23 DIAGNOSIS — M5126 Other intervertebral disc displacement, lumbar region: Secondary | ICD-10-CM | POA: Insufficient documentation

## 2022-10-23 LAB — BASIC METABOLIC PANEL
Anion gap: 10 (ref 5–15)
BUN: 17 mg/dL (ref 8–23)
CO2: 25 mmol/L (ref 22–32)
Calcium: 9 mg/dL (ref 8.9–10.3)
Chloride: 103 mmol/L (ref 98–111)
Creatinine, Ser: 1.25 mg/dL — ABNORMAL HIGH (ref 0.61–1.24)
GFR, Estimated: >60 mL/min (ref >60)
Glucose, Bld: 99 mg/dL (ref 70–99)
Potassium: 3.7 mmol/L (ref 3.5–5.1)
Sodium: 138 mmol/L (ref 135–145)

## 2022-10-23 LAB — CBC
HCT: 51.4 % (ref 39.0–52.0)
Hemoglobin: 17.2 g/dL — ABNORMAL HIGH (ref 13.0–17.0)
MCH: 30.8 pg (ref 26.0–34.0)
MCHC: 33.5 g/dL (ref 30.0–36.0)
MCV: 92.1 fL (ref 80.0–100.0)
Platelets: 261 10*3/uL (ref 150–400)
RBC: 5.58 MIL/uL (ref 4.22–5.81)
RDW: 12.7 % (ref 11.5–15.5)
WBC: 7.7 10*3/uL (ref 4.0–10.5)
nRBC: 0 % (ref 0.0–0.2)

## 2022-10-23 LAB — SURGICAL PCR SCREEN
MRSA, PCR: NEGATIVE
Staphylococcus aureus: NEGATIVE

## 2022-10-23 LAB — GLUCOSE, CAPILLARY: Glucose-Capillary: 100 mg/dL — ABNORMAL HIGH (ref 70–99)

## 2022-10-23 NOTE — Progress Notes (Addendum)
PCP - Fleet Contras hagler Cardiologist - Dr Elease Hashimoto   PPM/ICD - denies     Chest x-ray -N/A  EKG - 06/22/22 Stress Test - denies ECHO - 07/16/22 Cardiac Cath - denies   Sleep Study - N/A     Fasting Blood Sugar - CBG 100 at PAT, Last Hgb A1c 7.5 on 10/06/22. Pt reports he checks his blood sugar twice a day. (Typically 80-136).    Last dose of GLP1 agonist-  Ozempic-last dose to be 10/23/22 GLP1 instructions: Pt to hold after 10/23/22.     Blood Thinner Instructions: N/A Aspirin Instructions:N/A   ERAS Protcol - ERAS + G2  COVID TEST- N/A   Anesthesia review: review cardiac clearance, follow up on requested records- requested PCP note- pt reports seeing PCP in July with some medication adjustments for Diabetes. Previous note from Shonna Chock in chart- pt was re-scheduled for surgery d/t A1c of 8 previously. Pt denies any medical history changes since previous PAT appointment.    Patient denies shortness of breath, fever, cough and chest pain at PAT appointment     All instructions explained to the patient, with a verbal understanding of the material. Patient agrees to go over the instructions while at home for a better understanding. The opportunity to ask questions was provided.

## 2022-11-01 NOTE — Anesthesia Preprocedure Evaluation (Signed)
Anesthesia Evaluation  Patient identified by MRN, date of birth, ID band Patient awake    Reviewed: Allergy & Precautions, NPO status , Patient's Chart, lab work & pertinent test results, reviewed documented beta blocker date and time   History of Anesthesia Complications Negative for: history of anesthetic complications  Airway Mallampati: I  TM Distance: >3 FB Neck ROM: Full    Dental  (+) Missing, Dental Advisory Given, Edentulous Upper   Pulmonary neg pulmonary ROS   breath sounds clear to auscultation       Cardiovascular hypertension, Pt. on medications and Pt. on home beta blockers (-) angina + Valvular Problems/Murmurs (mild) AS  Rhythm:Regular Rate:Normal  06/2022 ECHO: EF 60-65%, normal LVF, Grade 1 DD, normal RVF, trivial MR, mod aortic valve calcification with mean gradient 11.4 mmHg, AVA 1.13 cm2    Neuro/Psych Back pain    GI/Hepatic Neg liver ROS,GERD  Controlled and Medicated,,  Endo/Other  diabetes, Oral Hypoglycemic Agents  Last Ozempic 10/23/2022  Renal/GU Renal InsufficiencyRenal disease     Musculoskeletal  (+) Arthritis ,    Abdominal   Peds  Hematology negative hematology ROS (+)   Anesthesia Other Findings   Reproductive/Obstetrics                             Anesthesia Physical Anesthesia Plan  ASA: 3  Anesthesia Plan: General   Post-op Pain Management: Tylenol PO (pre-op)*   Induction: Intravenous  PONV Risk Score and Plan: 2 and Ondansetron and Dexamethasone  Airway Management Planned: Oral ETT  Additional Equipment: None  Intra-op Plan:   Post-operative Plan: Extubation in OR  Informed Consent: I have reviewed the patients History and Physical, chart, labs and discussed the procedure including the risks, benefits and alternatives for the proposed anesthesia with the patient or authorized representative who has indicated his/her understanding and  acceptance.     Dental advisory given  Plan Discussed with: CRNA and Surgeon  Anesthesia Plan Comments:        Anesthesia Quick Evaluation

## 2022-11-02 ENCOUNTER — Ambulatory Visit (HOSPITAL_COMMUNITY): Payer: BC Managed Care – PPO

## 2022-11-02 ENCOUNTER — Other Ambulatory Visit: Payer: Self-pay

## 2022-11-02 ENCOUNTER — Encounter (HOSPITAL_COMMUNITY)
Admission: RE | Disposition: A | Discharge status: Home / Self Care | Payer: Self-pay | Source: Home / Self Care | Attending: Orthopedic Surgery

## 2022-11-02 ENCOUNTER — Ambulatory Visit (HOSPITAL_COMMUNITY): Payer: Self-pay | Admitting: Anesthesiology

## 2022-11-02 ENCOUNTER — Ambulatory Visit (HOSPITAL_COMMUNITY): Payer: BC Managed Care – PPO | Admitting: Physician Assistant

## 2022-11-02 ENCOUNTER — Observation Stay (HOSPITAL_COMMUNITY)
Admission: RE | Admit: 2022-11-02 | Discharge: 2022-11-03 | Disposition: A | Discharge status: Home / Self Care | Payer: BC Managed Care – PPO | Attending: Orthopedic Surgery | Admitting: Orthopedic Surgery

## 2022-11-02 ENCOUNTER — Encounter (HOSPITAL_COMMUNITY): Payer: Self-pay | Admitting: Orthopedic Surgery

## 2022-11-02 DIAGNOSIS — M4807 Spinal stenosis, lumbosacral region: Secondary | ICD-10-CM | POA: Diagnosis not present

## 2022-11-02 DIAGNOSIS — I129 Hypertensive chronic kidney disease with stage 1 through stage 4 chronic kidney disease, or unspecified chronic kidney disease: Secondary | ICD-10-CM | POA: Insufficient documentation

## 2022-11-02 DIAGNOSIS — E1122 Type 2 diabetes mellitus with diabetic chronic kidney disease: Secondary | ICD-10-CM | POA: Diagnosis not present

## 2022-11-02 DIAGNOSIS — M5416 Radiculopathy, lumbar region: Secondary | ICD-10-CM | POA: Diagnosis present

## 2022-11-02 DIAGNOSIS — M5126 Other intervertebral disc displacement, lumbar region: Principal | ICD-10-CM

## 2022-11-02 DIAGNOSIS — N189 Chronic kidney disease, unspecified: Secondary | ICD-10-CM | POA: Diagnosis not present

## 2022-11-02 DIAGNOSIS — M5417 Radiculopathy, lumbosacral region: Principal | ICD-10-CM | POA: Insufficient documentation

## 2022-11-02 HISTORY — PX: DECOMPRESSIVE LUMBAR LAMINECTOMY LEVEL 1: SHX5791

## 2022-11-02 LAB — GLUCOSE, CAPILLARY
Glucose-Capillary: 131 mg/dL — ABNORMAL HIGH (ref 70–99)
Glucose-Capillary: 237 mg/dL — ABNORMAL HIGH (ref 70–99)
Glucose-Capillary: 219 mg/dL — ABNORMAL HIGH (ref 70–99)
Glucose-Capillary: 199 mg/dL — ABNORMAL HIGH (ref 70–99)

## 2022-11-02 SURGERY — DECOMPRESSIVE LUMBAR LAMINECTOMY LEVEL 1
Anesthesia: General

## 2022-11-02 MED ORDER — HYDROMORPHONE HCL 1 MG/ML IJ SOLN
INTRAMUSCULAR | Status: DC | PRN
  Administered 2022-11-02: .25 mg via INTRAVENOUS

## 2022-11-02 MED ORDER — METHOCARBAMOL 500 MG PO TABS
500.0000 mg | ORAL_TABLET | Freq: Four times a day (QID) | ORAL | Status: DC
  Administered 2022-11-02 (×3): 500 mg via ORAL
  Filled 2022-11-02 (×3): qty 1

## 2022-11-02 MED ORDER — VANCOMYCIN HCL 1000 MG IV SOLR
INTRAVENOUS | Status: AC
  Filled 2022-11-02: qty 20

## 2022-11-02 MED ORDER — HYDROMORPHONE HCL 1 MG/ML IJ SOLN
INTRAMUSCULAR | Status: AC
  Filled 2022-11-02: qty 1

## 2022-11-02 MED ORDER — LIDOCAINE 2% (20 MG/ML) 5 ML SYRINGE
INTRAMUSCULAR | Status: DC | PRN
  Administered 2022-11-02: 60 mg via INTRAVENOUS

## 2022-11-02 MED ORDER — INSULIN ASPART 100 UNIT/ML IJ SOLN: 0.0000 [IU] | Freq: Every day | INTRAMUSCULAR | Status: DC

## 2022-11-02 MED ORDER — ROCURONIUM BROMIDE 10 MG/ML (PF) SYRINGE
PREFILLED_SYRINGE | INTRAVENOUS | Status: AC
  Filled 2022-11-02: qty 10

## 2022-11-02 MED ORDER — ROCURONIUM BROMIDE 10 MG/ML (PF) SYRINGE
PREFILLED_SYRINGE | INTRAVENOUS | Status: DC | PRN
  Administered 2022-11-02 (×3): 10 mg via INTRAVENOUS
  Administered 2022-11-02: 60 mg via INTRAVENOUS

## 2022-11-02 MED ORDER — POLYETHYLENE GLYCOL 3350 17 G PO PACK
17.0000 g | PACK | Freq: Every day | ORAL | Status: DC
  Administered 2022-11-02: 17 g via ORAL
  Filled 2022-11-02: qty 1

## 2022-11-02 MED ORDER — THROMBIN 20000 UNITS EX SOLR
CUTANEOUS | Status: DC | PRN
  Administered 2022-11-02: 20 mL via TOPICAL

## 2022-11-02 MED ORDER — FENTANYL CITRATE (PF) 250 MCG/5ML IJ SOLN
INTRAMUSCULAR | Status: DC | PRN
  Administered 2022-11-02: 50 ug via INTRAVENOUS
  Administered 2022-11-02: 100 ug via INTRAVENOUS
  Administered 2022-11-02 (×2): 50 ug via INTRAVENOUS

## 2022-11-02 MED ORDER — POVIDONE-IODINE 10 % EX SWAB
2.0000 | Freq: Once | CUTANEOUS | Status: AC
  Administered 2022-11-02: 2 via TOPICAL

## 2022-11-02 MED ORDER — CHLORHEXIDINE GLUCONATE 0.12 % MT SOLN
OROMUCOSAL | Status: AC
  Administered 2022-11-02: 15 mL
  Filled 2022-11-02: qty 15

## 2022-11-02 MED ORDER — OXYCODONE HCL 5 MG PO TABS
5.0000 mg | ORAL_TABLET | ORAL | Status: DC | PRN
  Administered 2022-11-02 – 2022-11-03 (×3): 5 mg via ORAL
  Administered 2022-11-03: 10 mg via ORAL
  Filled 2022-11-02: qty 2
  Filled 2022-11-02 (×3): qty 1

## 2022-11-02 MED ORDER — ONDANSETRON HCL 4 MG/2ML IJ SOLN: 4.0000 mg | Freq: Four times a day (QID) | INTRAMUSCULAR | Status: DC | PRN

## 2022-11-02 MED ORDER — PROPOFOL 10 MG/ML IV BOLUS
INTRAVENOUS | Status: DC | PRN
Start: 2022-11-02 — End: 2022-11-02
  Administered 2022-11-02: 120 mg via INTRAVENOUS

## 2022-11-02 MED ORDER — THROMBIN 20000 UNITS EX KIT
PACK | CUTANEOUS | Status: AC
  Filled 2022-11-02: qty 1

## 2022-11-02 MED ORDER — ACETAMINOPHEN 500 MG PO TABS
1000.0000 mg | ORAL_TABLET | Freq: Three times a day (TID) | ORAL | Status: DC
  Administered 2022-11-02 – 2022-11-03 (×3): 1000 mg via ORAL
  Filled 2022-11-02 (×3): qty 2

## 2022-11-02 MED ORDER — PROPOFOL 10 MG/ML IV BOLUS
INTRAVENOUS | Status: AC
  Filled 2022-11-02: qty 20

## 2022-11-02 MED ORDER — PHENYLEPHRINE HCL-NACL 20-0.9 MG/250ML-% IV SOLN
INTRAVENOUS | Status: DC | PRN
  Administered 2022-11-02: 50 ug/min via INTRAVENOUS

## 2022-11-02 MED ORDER — MEPERIDINE HCL 25 MG/ML IJ SOLN: 6.2500 mg | INTRAMUSCULAR | Status: DC | PRN

## 2022-11-02 MED ORDER — MIDAZOLAM HCL 2 MG/2ML IJ SOLN
INTRAMUSCULAR | Status: DC | PRN
  Administered 2022-11-02 (×2): 1 mg via INTRAVENOUS

## 2022-11-02 MED ORDER — SENNA 8.6 MG PO TABS
1.0000 | ORAL_TABLET | Freq: Two times a day (BID) | ORAL | Status: DC
  Administered 2022-11-02 (×2): 8.6 mg via ORAL
  Filled 2022-11-02 (×2): qty 1

## 2022-11-02 MED ORDER — MIDAZOLAM HCL 2 MG/2ML IJ SOLN
INTRAMUSCULAR | Status: AC
  Filled 2022-11-02: qty 2

## 2022-11-02 MED ORDER — CEFAZOLIN SODIUM-DEXTROSE 2-4 GM/100ML-% IV SOLN
2.0000 g | Freq: Four times a day (QID) | INTRAVENOUS | Status: AC
  Administered 2022-11-02 – 2022-11-03 (×3): 2 g via INTRAVENOUS
  Filled 2022-11-02 (×3): qty 100

## 2022-11-02 MED ORDER — VANCOMYCIN HCL 1000 MG IV SOLR
INTRAVENOUS | Status: DC | PRN
Start: 2022-11-02 — End: 2022-11-02
  Administered 2022-11-02: 1000 mg

## 2022-11-02 MED ORDER — MIDAZOLAM HCL 2 MG/2ML IJ SOLN: 0.5000 mg | Freq: Once | INTRAMUSCULAR | Status: DC | PRN

## 2022-11-02 MED ORDER — INSULIN ASPART 100 UNIT/ML IJ SOLN
0.0000 [IU] | Freq: Three times a day (TID) | INTRAMUSCULAR | Status: DC
  Administered 2022-11-02: 5 [IU] via SUBCUTANEOUS
  Administered 2022-11-03: 2 [IU] via SUBCUTANEOUS

## 2022-11-02 MED ORDER — FENTANYL CITRATE (PF) 250 MCG/5ML IJ SOLN
INTRAMUSCULAR | Status: AC
  Filled 2022-11-02: qty 5

## 2022-11-02 MED ORDER — EMPAGLIFLOZIN 25 MG PO TABS: 25.0000 mg | ORAL_TABLET | Freq: Every day | ORAL | Status: DC

## 2022-11-02 MED ORDER — PHENYLEPHRINE 80 MCG/ML (10ML) SYRINGE FOR IV PUSH (FOR BLOOD PRESSURE SUPPORT)
PREFILLED_SYRINGE | INTRAVENOUS | Status: DC | PRN
  Administered 2022-11-02 (×6): 160 ug via INTRAVENOUS

## 2022-11-02 MED ORDER — HYDROMORPHONE HCL 1 MG/ML IJ SOLN: 0.5000 mg | INTRAMUSCULAR | Status: DC | PRN

## 2022-11-02 MED ORDER — DEXAMETHASONE SODIUM PHOSPHATE 10 MG/ML IJ SOLN
INTRAMUSCULAR | Status: AC
  Filled 2022-11-02: qty 1

## 2022-11-02 MED ORDER — LACTATED RINGERS IV SOLN: INTRAVENOUS | Status: DC | PRN

## 2022-11-02 MED ORDER — SUGAMMADEX SODIUM 200 MG/2ML IV SOLN
INTRAVENOUS | Status: DC | PRN
  Administered 2022-11-02: 200 mg via INTRAVENOUS

## 2022-11-02 MED ORDER — OXYCODONE HCL 5 MG PO TABS: 5.0000 mg | ORAL_TABLET | Freq: Once | ORAL | Status: DC | PRN

## 2022-11-02 MED ORDER — ALBUMIN HUMAN 5 % IV SOLN: INTRAVENOUS | Status: DC | PRN

## 2022-11-02 MED ORDER — TAMSULOSIN HCL 0.4 MG PO CAPS
0.4000 mg | ORAL_CAPSULE | Freq: Every day | ORAL | Status: DC
  Administered 2022-11-02: 0.4 mg via ORAL
  Filled 2022-11-02: qty 1

## 2022-11-02 MED ORDER — DEXAMETHASONE SODIUM PHOSPHATE 10 MG/ML IJ SOLN
10.0000 mg | Freq: Once | INTRAMUSCULAR | Status: AC
  Administered 2022-11-02: 10 mg via INTRAVENOUS
  Filled 2022-11-02: qty 1

## 2022-11-02 MED ORDER — 0.9 % SODIUM CHLORIDE (POUR BTL) OPTIME
TOPICAL | Status: DC | PRN
  Administered 2022-11-02: 1000 mL

## 2022-11-02 MED ORDER — ONDANSETRON HCL 4 MG/2ML IJ SOLN
INTRAMUSCULAR | Status: AC
  Filled 2022-11-02: qty 2

## 2022-11-02 MED ORDER — HYDROMORPHONE HCL 1 MG/ML IJ SOLN
INTRAMUSCULAR | Status: AC
  Filled 2022-11-02: qty 0.5

## 2022-11-02 MED ORDER — ATORVASTATIN CALCIUM 40 MG PO TABS
40.0000 mg | ORAL_TABLET | Freq: Every evening | ORAL | Status: DC
  Administered 2022-11-02: 40 mg via ORAL
  Filled 2022-11-02: qty 1

## 2022-11-02 MED ORDER — ONDANSETRON HCL 4 MG PO TABS: 4.0000 mg | ORAL_TABLET | Freq: Four times a day (QID) | ORAL | Status: DC | PRN

## 2022-11-02 MED ORDER — SUCCINYLCHOLINE CHLORIDE 200 MG/10ML IV SOSY
PREFILLED_SYRINGE | INTRAVENOUS | Status: AC
  Filled 2022-11-02: qty 10

## 2022-11-02 MED ORDER — EPHEDRINE SULFATE-NACL 50-0.9 MG/10ML-% IV SOSY
PREFILLED_SYRINGE | INTRAVENOUS | Status: DC | PRN
  Administered 2022-11-02 (×6): 5 mg via INTRAVENOUS

## 2022-11-02 MED ORDER — CEFAZOLIN SODIUM-DEXTROSE 2-4 GM/100ML-% IV SOLN
2.0000 g | INTRAVENOUS | Status: AC
  Administered 2022-11-02: 2 g via INTRAVENOUS
  Filled 2022-11-02: qty 100

## 2022-11-02 MED ORDER — LOSARTAN POTASSIUM 50 MG PO TABS: 100.0000 mg | ORAL_TABLET | Freq: Every morning | ORAL | Status: DC

## 2022-11-02 MED ORDER — PROMETHAZINE HCL 25 MG/ML IJ SOLN: 6.2500 mg | INTRAMUSCULAR | Status: DC | PRN

## 2022-11-02 MED ORDER — OXYCODONE HCL 5 MG/5ML PO SOLN: 5.0000 mg | Freq: Once | ORAL | Status: DC | PRN

## 2022-11-02 MED ORDER — GLIPIZIDE ER 10 MG PO TB24
10.0000 mg | ORAL_TABLET | Freq: Every day | ORAL | Status: DC
  Filled 2022-11-02: qty 1

## 2022-11-02 MED ORDER — GABAPENTIN 300 MG PO CAPS
300.0000 mg | ORAL_CAPSULE | Freq: Two times a day (BID) | ORAL | Status: DC
  Administered 2022-11-02: 300 mg via ORAL
  Filled 2022-11-02: qty 1

## 2022-11-02 MED ORDER — LIDOCAINE 2% (20 MG/ML) 5 ML SYRINGE
INTRAMUSCULAR | Status: AC
  Filled 2022-11-02: qty 5

## 2022-11-02 MED ORDER — METOPROLOL SUCCINATE ER 25 MG PO TB24
25.0000 mg | ORAL_TABLET | Freq: Every day | ORAL | Status: DC
  Administered 2022-11-02: 25 mg via ORAL
  Filled 2022-11-02: qty 1

## 2022-11-02 MED ORDER — ONDANSETRON HCL 4 MG/2ML IJ SOLN
INTRAMUSCULAR | Status: DC | PRN
  Administered 2022-11-02: 4 mg via INTRAVENOUS

## 2022-11-02 MED ORDER — AMLODIPINE BESYLATE 5 MG PO TABS: 5.0000 mg | ORAL_TABLET | Freq: Every day | ORAL | Status: DC

## 2022-11-02 MED ORDER — HYDROMORPHONE HCL 1 MG/ML IJ SOLN
0.2500 mg | INTRAMUSCULAR | Status: DC | PRN
  Administered 2022-11-02 (×4): 0.5 mg via INTRAVENOUS

## 2022-11-02 MED ORDER — METFORMIN HCL ER 500 MG PO TB24
1000.0000 mg | ORAL_TABLET | Freq: Two times a day (BID) | ORAL | Status: DC
  Administered 2022-11-02 – 2022-11-03 (×2): 1000 mg via ORAL
  Filled 2022-11-02 (×2): qty 2

## 2022-11-02 MED ORDER — ACETAMINOPHEN 500 MG PO TABS
1000.0000 mg | ORAL_TABLET | Freq: Once | ORAL | Status: DC
  Filled 2022-11-02: qty 2

## 2022-11-02 MED ORDER — TRANEXAMIC ACID-NACL 1000-0.7 MG/100ML-% IV SOLN
1000.0000 mg | INTRAVENOUS | Status: AC
  Administered 2022-11-02: 1000 mg via INTRAVENOUS
  Filled 2022-11-02: qty 100

## 2022-11-02 MED ORDER — HYDROCHLOROTHIAZIDE 12.5 MG PO TABS: 12.5000 mg | ORAL_TABLET | Freq: Every morning | ORAL | Status: DC

## 2022-11-02 MED ORDER — TRANEXAMIC ACID-NACL 1000-0.7 MG/100ML-% IV SOLN
1000.0000 mg | Freq: Once | INTRAVENOUS | Status: AC
  Administered 2022-11-02: 1000 mg via INTRAVENOUS
  Filled 2022-11-02: qty 100

## 2022-11-02 SURGICAL SUPPLY — 55 items
ADH SKN CLS LQ APL DERMABOND (GAUZE/BANDAGES/DRESSINGS) ×1
APL SKNCLS STERI-STRIP NONHPOA (GAUZE/BANDAGES/DRESSINGS) ×1
BENZOIN TINCTURE PRP APPL 2/3 (GAUZE/BANDAGES/DRESSINGS) ×1 IMPLANT
BUR MATCHSTICK NEURO 3.0 LAGG (BURR) ×1 IMPLANT
CANISTER SUCT 3000ML PPV (MISCELLANEOUS) ×1 IMPLANT
CLSR STERI-STRIP ANTIMIC 1/2X4 (GAUZE/BANDAGES/DRESSINGS) IMPLANT
CORD BIPOLAR FORCEPS 12FT (ELECTRODE) IMPLANT
COVER MAYO STAND STRL (DRAPES) ×3 IMPLANT
COVER SURGICAL LIGHT HANDLE (MISCELLANEOUS) ×1 IMPLANT
DERMABOND ADVANCED .7 DNX6 (GAUZE/BANDAGES/DRESSINGS) IMPLANT
DRAIN HEMOVAC 7FR (DRAIN) ×1 IMPLANT
DRAPE C-ARM 42X72 X-RAY (DRAPES) ×1 IMPLANT
DRAPE UTILITY XL STRL (DRAPES) ×2 IMPLANT
DRESSING MEPILEX FLEX 4X4 (GAUZE/BANDAGES/DRESSINGS) ×1 IMPLANT
DRSG MEPILEX FLEX 4X4 (GAUZE/BANDAGES/DRESSINGS)
DRSG MEPILEX POST OP 4X8 (GAUZE/BANDAGES/DRESSINGS) ×1 IMPLANT
DRSG TEGADERM 4X10 (GAUZE/BANDAGES/DRESSINGS) ×1 IMPLANT
DRSG TEGADERM 4X4.5 CHG (GAUZE/BANDAGES/DRESSINGS) IMPLANT
DRSG TEGADERM 4X4.75 (GAUZE/BANDAGES/DRESSINGS) ×3 IMPLANT
DURAPREP 26ML APPLICATOR (WOUND CARE) ×1 IMPLANT
ELECT BLADE 4.0 EZ CLEAN MEGAD (MISCELLANEOUS) ×1
ELECT PENCIL ROCKER SW 15FT (MISCELLANEOUS) ×1 IMPLANT
ELECT REM PT RETURN 9FT ADLT (ELECTROSURGICAL) ×1
ELECTRODE BLDE 4.0 EZ CLN MEGD (MISCELLANEOUS) ×1 IMPLANT
ELECTRODE REM PT RTRN 9FT ADLT (ELECTROSURGICAL) ×1 IMPLANT
GAUZE SPONGE 4X4 12PLY STRL (GAUZE/BANDAGES/DRESSINGS) ×1 IMPLANT
GLOVE BIO SURGEON STRL SZ7.5 (GLOVE) ×1 IMPLANT
GLOVE INDICATOR 7.5 STRL GRN (GLOVE) ×1 IMPLANT
GOWN STRL REUS W/ TWL LRG LVL3 (GOWN DISPOSABLE) ×1 IMPLANT
GOWN STRL REUS W/TWL LRG LVL3 (GOWN DISPOSABLE) ×1
GOWN STRL SURGICAL XL XLNG (GOWN DISPOSABLE) ×1 IMPLANT
KIT BASIN OR (CUSTOM PROCEDURE TRAY) ×1 IMPLANT
KIT POSITION SURG JACKSON T1 (MISCELLANEOUS) ×1 IMPLANT
KIT TURNOVER KIT B (KITS) ×1 IMPLANT
NDL 22X1.5 STRL (OR ONLY) (MISCELLANEOUS) ×1 IMPLANT
NEEDLE 22X1.5 STRL (OR ONLY) (MISCELLANEOUS) ×1
NS IRRIG 1000ML POUR BTL (IV SOLUTION) ×1 IMPLANT
PACK LAMINECTOMY ORTHO (CUSTOM PROCEDURE TRAY) ×1 IMPLANT
PATTIES SURGICAL .5 X.5 (GAUZE/BANDAGES/DRESSINGS) IMPLANT
SPONGE SURGIFOAM ABS GEL 100 (HEMOSTASIS) ×1 IMPLANT
SPONGE T-LAP 4X18 ~~LOC~~+RFID (SPONGE) ×1 IMPLANT
SUCTION TUBE FRAZIER 10FR DISP (SUCTIONS) ×1 IMPLANT
SUT BONE WAX W31G (SUTURE) ×1 IMPLANT
SUT ETHILON 2 0 FS 18 (SUTURE) IMPLANT
SUT MNCRL AB 3-0 PS2 18 (SUTURE) IMPLANT
SUT MNCRL+ AB 3-0 CT1 36 (SUTURE) ×1 IMPLANT
SUT VIC AB 0 CT1 18XCR BRD8 (SUTURE) ×1 IMPLANT
SUT VIC AB 0 CT1 8-18 (SUTURE) ×1
SUT VIC AB 2-0 CT1 18 (SUTURE) ×1 IMPLANT
SYR BULB IRRIG 60ML STRL (SYRINGE) ×1 IMPLANT
SYR CONTROL 10ML LL (SYRINGE) ×1 IMPLANT
TOWEL GREEN STERILE (TOWEL DISPOSABLE) ×1 IMPLANT
TOWEL GREEN STERILE FF (TOWEL DISPOSABLE) ×1 IMPLANT
TUBING FEATHERFLOW (TUBING) ×1 IMPLANT
WATER STERILE IRR 1000ML POUR (IV SOLUTION) ×1 IMPLANT

## 2022-11-02 NOTE — H&P (Signed)
Orthopedic Spine Surgery H&P Note  Assessment: Patient is a 72 y.o. 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 Those risks included but were not limited to: iatrogenic instability, dural tear, nerve root injury, paralysis, persistent pain, infection, bleeding, heart attack, death, stroke, fracture, blindness, and need for additional procedures were discussed with the patient. The benefit of the surgery would be to help with the radiating leg pain. I explained that back pain relief is not the goal of the surgery and it is not reliably alleviated with this surgery. The alternatives to surgical management were covered with the patient and included continued monitoring, physical therapy, over-the-counter pain medications, ambulatory aids, repeat injections, and activity modification. All the patient's questions were answered to his satisfaction. After this discussion, the patient expressed understanding and elected to proceed with surgical intervention.    ___________________________________________________________________________  Chief Complaint: low back pain that radiates into bilateral legs  History: Patient is 72 y.o. male who has been previously seen in the office for low back pain that radiates into bilateral legs. Work up was consistent with lumbar radiculopathy. These symptoms failed to improve with conservative treatment so operative management was discussed at the last office visit. The patient presents today with no changes in  their symptoms since the last office visit. See previous office note for further details.    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) GERD BPH HTN CKD   Allergies: aspirin, penicillin   Past surgical history:  Bilateral shoulder rotator cuff repair Parathyroid excision Bilateral carpal tunnel release Bilateral knee arthroscopy   Social history: Denies use of nicotine product (smoking, vaping, patches, smokeless) Alcohol use: Denies Denies recreational drug use  Family history: -reviewed and not pertinent to lumbar radiculopathy   Physical Exam:  BMI of 25.2  General: no acute distress, appears stated age Neurologic: alert, answering questions appropriately, following commands Cardiovascular: regular rate, no cyanosis Respiratory: unlabored breathing on room air, symmetric chest rise Psychiatric: appropriate affect, normal cadence to speech   MSK (spine):  -Strength exam      Left  Right  EHL    5/5  5/5 TA    5/5  5/5 GSC    5/5  5/5 Knee extension  5/5  5/5 Knee flexion   5/5  5/5 Hip flexion   5/5  5/5  -Sensory exam    Sensation intact to light touch in L3-S1 nerve distributions of bilateral lower extremities   Patient name: Ronald [name]. Patient MRN: [id number] Date: 11/02/22

## 2022-11-02 NOTE — Anesthesia Postprocedure Evaluation (Signed)
Anesthesia Post Note  Patient: Ronald [name].  Procedure(s) Performed: L4-5, L5-S1 LAMINECTOMY AND FORAMINOTOMIES     Patient location during evaluation: PACU Anesthesia Type: General Level of consciousness: sedated, patient cooperative and oriented Pain management: pain level controlled Vital Signs Assessment: post-procedure vital signs reviewed and stable Respiratory status: spontaneous breathing, nonlabored ventilation, respiratory function stable and patient connected to nasal cannula oxygen Cardiovascular status: blood pressure returned to baseline and stable Postop Assessment: no apparent nausea or vomiting Anesthetic complications: no   No notable events documented.  Last Vitals:  Vitals:   11/02/22 1345 11/02/22 1400  BP: 115/64 117/63  Pulse: (!) 112 (!) 108  Resp: 17 12  Temp:    SpO2: 96% 92%    Last Pain:  Vitals:   11/02/22 1345  PainSc: Asleep                 ,E. 

## 2022-11-02 NOTE — Transfer of Care (Signed)
Immediate Anesthesia Transfer of Care Note  Patient: Ronald [name].  Procedure(s) Performed: L4-5, L5-S1 LAMINECTOMY AND FORAMINOTOMIES  Patient Location: PACU  Anesthesia Type:General  Level of Consciousness: drowsy, patient cooperative, and responds to stimulation  Airway & Oxygen Therapy: Patient Spontanous Breathing and Patient connected to face mask oxygen  Post-op Assessment: Report given to RN and Post -op Vital signs reviewed and stable  Post vital signs: Reviewed and stable  Last Vitals:  Vitals Value Taken Time  BP 120/66 11/02/22 1218  Temp    Pulse 114 11/02/22 1222  Resp 25 11/02/22 1222  SpO2 98 % 11/02/22 1222  Vitals shown include unfiled device data.  Last Pain:  Vitals:   11/02/22 0628  PainSc: 6       Patients Stated Pain Goal: 2 (11/02/22 1610)  Complications: No notable events documented.

## 2022-11-02 NOTE — Progress Notes (Signed)
Orthopedic Surgery Post-operative Progress Note  Assessment: Patient is a 72 y.o. male who is currently admitted after undergoing L4-S1 laminectomies and foraminotomies   Plan: -Operative plans complete -Drains to be maintained for now -Out of bed as tolerated, no brace -No bending/lifting/twisting greater than 10 pounds -PT evaluate and treat -Pain control -Diabetic diet -No chemoprophylaxis for dvt or antiplatelets for 72 hours after surgery -Ancef x2 post-operative doses -Disposition: to floor from PACU  HTN -continue home amlodipine, losartan, hydrochlorothiazide  DM -sliding scale insulin -diabetic diet -continue home metformin, glipizide, jardiance  HLD -continue home lipitor  BPH -continue home flomax  CKD -will get BMP tomorrow morning  ___________________________________________________________________________   Subjective: No acute events since surgery. Having a lot of back pain that PACU is controlling. Not having any leg pain at the moment.   Objective:  General: no acute distress, appropriate affect Neurologic: alert, answering questions appropriately, following commands Respiratory: unlabored breathing on room air Skin: dressing clear/dry/intact, drains with minimal sanguinous output  MSK (spine):  -Strength exam      Right  Left  EHL    5/5  5/5 TA    5/5  5/5 GSC    5/5  5/5 Knee extension  5/5  5/5 Hip flexion   5/5  5/5  -Sensory exam    Sensation intact to light touch in L3-S1 nerve distributions of bilateral lower extremities   Patient name: Ronald [name]. Patient MRN: [id number] Date: 11/02/22

## 2022-11-02 NOTE — Anesthesia Procedure Notes (Signed)
Procedure Name: Intubation Date/Time: 11/02/2022 7:36 AM  Performed by: Shary Decamp, CRNAPre-anesthesia Checklist: Patient identified, Patient being monitored, Timeout performed, Emergency Drugs available and Suction available Patient Re-evaluated:Patient Re-evaluated prior to induction Oxygen Delivery Method: Circle System Utilized Preoxygenation: Pre-oxygenation with 100% oxygen Induction Type: IV induction Ventilation: Mask ventilation without difficulty Laryngoscope Size: Miller and 2 Grade View: Grade I Tube type: Oral Tube size: 7.5 mm Number of attempts: 1 Airway Equipment and Method: Stylet Placement Confirmation: ETT inserted through vocal cords under direct vision, positive ETCO2 and breath sounds checked- equal and bilateral Secured at: 22 cm Tube secured with: Tape Dental Injury: Teeth and Oropharynx as per pre-operative assessment

## 2022-11-02 NOTE — Op Note (Signed)
Orthopedic Spine Surgery Operative Report  Procedure: L4, L5, S1 lumbar laminectomies with bilateral foraminotomies  Modifier: none  Date of procedure: 11/02/2022  Patient name: Ronald [name]. 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 male who presented to the office with symptoms consistent with lumbar radiculopathy. The patient had tried conservative treatments that did not provide any lasting relief. As result, operative management was discussed. A L4, L5, S1 laminectomy with bilateral foraminotomies was presented as a treatment option. The risks including but not limited to iatrogenic instability, dural tear, nerve root injury, paralysis, persistent pain, infection, bleeding, heart attack, death, stroke, fracture, blindness, and need for additional procedures were discussed with the patient. The benefit of the surgery would be improvement in the patient's radiating leg pain. The alternatives to surgical management were covered with the patient and included continued monitoring, physical therapy, over-the-counter pain medications, ambulatory aids, repeat injections, and activity modification. All the patient's questions were answered to his satisfaction. After this discussion, the patient expressed understanding and elected to proceed with surgical intervention.   Procedure Description: The patient was met in the pre-operative holding area. The patient's identity and consent were verified. The operative site was marked. The  patient's remaining questions about the surgery were answered. The patient was brought back to the operating room. General anesthesia was induced and an endotracheal tube was placed by the anesthesia staff. The patient was transferred to the prone [place] table in the prone position. All bony prominences were well padded. The head of the bed was slightly elevated and the eyes were free from compression by the face pillow. An electric razor was used to remove his hair over the lumbar region. The surgical area was cleansed with alcohol. Fluoroscopy was then brought in to check rotation on the AP image and to mark the levels on the lateral image. The patient's skin was then prepped and draped in a standard, sterile fashion. A time out was performed that identified the patient, the procedure, and the operative levels. All team members agreed with what was stated in the time out.   A midline incision over the spinous processes of the previously marked levels was made and sharp dissection was continued down through the skin and dermis. Electrocautery was then used to continue the midline dissection down to the level of the spinous process. Subperiosteal dissection was performed using electrocautery to expose the lamina out lateral to the facet joint capsule. Care was taken to not violate the facet joint capsules. A lateral fluoroscopic image was taken to confirm the level. Subperiosteal dissection with electrocautery was then done to expose the lamina of L4, L5, and the cranial aspect of S1. Again, care was taken to avoid disruption of the facet capsules.    A rongeur was used to remove the spinous processes and interspinous ligaments between the L3/4 interspinous ligament to the cranial portion of the S1 spinous process. Bone wax was used to obtain hemostasis at the bleeding bony surfaces. A high-speed burr was used to thin the lamina at all planned laminectomy levels to the level of the ligamentum flavum. Above the  level of  the ligamentum, the lamina was thinned with the burr to the approximate level of the ligamentum. Care was taken to leave at least 8mm of pars interarticularis on each side. A series of Kerrison rongeurs were used to remove the remaining lamina and ligamentum overlying the thecal sac. A woodsen was used to protect the thecal sac as the medial portion of the bilateral L4/5 facet joints were removed with a kerrison. A woodsen was then placed on top of the L4 nerve to protect it and a combination of curettes and kerrisons were used to remove bone and soft tissue over the exiting L4 nerve root. The same process was repeated on the contralateral side. A woodsen was then placed over the L5 nerve root to repeat the same steps as above for the L4 nerve root on both sides.   A woodsen was placed into the laminectomy site to palpate for any remaining areas of stenosis. No further areas of stenosis were palpated. A woodsen was then easy to pass into the foramen with the exiting L4 and L5 nerve roots bilaterally.   The wound was copiously irrigated with sterile saline. 1g of vancomycin powder was placed into the wound. A medium hemovac drain was placed deep to the fascia. The fascia was reapproximated with 0 vicryl suture. A subcutaneous hemovac drain was placed above the fascia. The subcutaneous fat was reapproximated with 0 vicryl suture. The deep dermal layer was reapproximated with 2-0 viryl. The skin as closed with a 3-0 running moncryl. All counts were correct at the end of the case. The incision was dressed with steri strips and benzoine. An island dressing was placed over the wound. The patient was transferred back to a bed and brought to the post-anesthesia care unit by anesthesia staff in stable condition.  Post-operative plan: The patient will recover in the post-anesthesia care unit and then go to the floor. The patient will receive two post-operative doses of ancef. The patient will be out of bed as  tolerated with no brace. The patient will work with physical therapy. The drains will likely be removed on the day of discharge. The patient's disposition will be determined based off how he is doing on the floor post-operatively.     Willia Craze, MD Orthopedic Surgeon

## 2022-11-02 NOTE — Brief Op Note (Signed)
11/02/2022  12:11 PM  PATIENT:  Ronald [name].  72 y.o. male  PRE-OPERATIVE DIAGNOSIS:  LUMBAR RADICULOPATHY  POST-OPERATIVE DIAGNOSIS:  LUMBAR RADICULOPATHY  PROCEDURE:  Procedure(s): L4-5, L5-S1 LAMINECTOMY AND FORAMINOTOMIES (N/A)  SURGEON:  Surgeons and Role:    London Sheer, MD - Primary  PHYSICIAN ASSISTANT: None  ASSISTANTS: Youlanda Roys, RNFA  ANESTHESIA:   general  EBL:  300 mL   BLOOD ADMINISTERED:none  DRAINS:  2 medium hemovacs in patients back    LOCAL MEDICATIONS USED:  NONE  SPECIMEN:  No Specimen  DISPOSITION OF SPECIMEN:  N/A  COUNTS:  YES  TOURNIQUET: NONE  DICTATION: .Note written in EPIC  PLAN OF CARE: Admit for overnight observation  PATIENT DISPOSITION:  PACU - hemodynamically stable.   Delay start of Pharmacological VTE agent (>24hrs) due to surgical blood loss or risk of bleeding: yes

## 2022-11-03 ENCOUNTER — Encounter (HOSPITAL_COMMUNITY): Payer: Self-pay | Admitting: Orthopedic Surgery

## 2022-11-03 DIAGNOSIS — M5417 Radiculopathy, lumbosacral region: Secondary | ICD-10-CM | POA: Diagnosis not present

## 2022-11-03 LAB — GLUCOSE, CAPILLARY: Glucose-Capillary: 148 mg/dL — ABNORMAL HIGH (ref 70–99)

## 2022-11-03 MED ORDER — METHOCARBAMOL 500 MG PO TABS: 500.0000 mg | ORAL_TABLET | Freq: Four times a day (QID) | ORAL | 0 refills | Status: AC

## 2022-11-03 MED ORDER — OXYCODONE HCL 5 MG PO TABS: 5.0000 mg | ORAL_TABLET | ORAL | 0 refills | Status: DC | PRN

## 2022-11-03 MED ORDER — SENNA 8.6 MG PO TABS: 1.0000 | ORAL_TABLET | Freq: Two times a day (BID) | ORAL | 0 refills | Status: AC

## 2022-11-03 MED ORDER — ACETAMINOPHEN 500 MG PO TABS: 1000.0000 mg | ORAL_TABLET | Freq: Three times a day (TID) | ORAL | 0 refills | Status: AC

## 2022-11-03 MED ORDER — POLYETHYLENE GLYCOL 3350 17 G PO PACK: 17.0000 g | PACK | Freq: Every day | ORAL | 0 refills | Status: AC

## 2022-11-03 NOTE — Discharge Instructions (Addendum)
Orthopedic Surgery Discharge Instructions  Patient name: Ronald [name]. Procedure Performed: L4-S1 laminectomies and foraminotomies Date of Surgery: 11/02/2022 Surgeon: Willia Craze, MD  Pre-operative Diagnosis: lumbar radiculopathy Post-operative Diagnosis: same as above  Discharge Date: 11/03/2022 Discharged to: home Discharge Condition: stable  Activity: You should refrain from bending, lifting, or twisting with objects greater than ten pounds until three months after surgery. You are encouraged to walk as much as desired. You can perform household activities such as cleaning dishes, doing laundry, vacuuming, etc. as long as the ten-pound restriction is followed. You do not need to wear a brace during the post-operative period.   Incision Care: Your incision site has a dressing over it. That dressing should remain in place and dry at all times for a total of one week after surgery. After one week, you can remove the dressing. Underneath the dressing, you will find pieces of tape. You should leave these pieces of tape in place. They will fall off with time. Do not pick, rub, or scrub at them. Do not put cream or lotion over the surgical area. After one week and once the dressing is off, it is okay to let soap and water run over your incision. Again, do not pick, scrub, or rub at the pieces of tape when bathing. Do not submerge (e.g., take a bath, swim, go in a hot tub, etc.) until six weeks after surgery. There may be some bloody drainage from the incision into the dressing after surgery. This is normal. You do not need to replace the dressing. Continue to leave it in place for the one week as instructed above. Should the dressing become saturated with blood or drainage, please call the office for further instructions.   Medications: You have been prescribed oxycodone. This is a narcotic pain medication and should only be taken as prescribed. You should not drink alcohol or operate heavy  machinery (including driving) while taking this medication. The oxycodone can cause constipation as a side effect. For that reason, you have been prescribed senna and miralax. These are both laxatives. You do not need to take this medication if you develop diarrhea. Should you remain constipated even while taking these medications, please increase the dose of miralax to twice daily. Tylenol has been prescribed to be taken every 8 hours, which will give you additional pain relief. Robaxin is a muscle relaxer that has been prescribed to you for muscle spasm type pain. Take this medication as needed.   You can use over-the-counter NSAIDs (ibuprofen, Aleve, Celebrex, naproxen, meloxicam, etc.) for additional pain relief after this surgery. These medications are safe to take with the Tylenol you have been prescribed. You should not take these medications if you have or have had kidney problems or gastrointestinal ulcers. Take these medications as instructed on the packaging.   In order to set expectations for opioid prescriptions, you will only be prescribed opioids for a total of six weeks after surgery and, at two-weeks after surgery, your opioid prescription will start to tapered (decreased dosage and number of pills). If you have ongoing need for opioid medication six weeks after surgery, you will be referred to pain management. If you are already established with a provider that is giving you opioid medications, you should schedule an appointment with them for six weeks after surgery if you feel you are going to need another prescription. State law only allows for opioid prescriptions one week at a time. If you are running out of opioid medication  near the end of the week, please call the office during business hours before running out so I can send you another prescription.   You may resume any home blood thinners (warfarin, lovenox, apixaban, plavix, xarelto, etc) 72 hours after your surgery. Take these  medications as they were previously prescribed.  Driving: You should not drive while taking narcotic pain medications. You should start getting back to driving slowly and you may want to try driving in a parking lot before doing anything more.   Diet: You are safe to resume your regular diet after surgery.   Reasons to Call the Office After Surgery: You should feel free to call the office with any concerns or questions you have in the post-operative period, but you should definitely notify the office if you develop: -shortness of breath, chest pain, or trouble breathing -excessive bleeding, drainage, redness, or swelling around the surgical site -fevers, chills, or pain that is getting worse with each passing day -persistent nausea or vomiting -new weakness in either leg -new or worsening numbness or tingling in either leg -numbness in the groin, bowel or bladder incontinence -other concerns about your surgery  Follow Up Appointments: You have a follow up appointment scheduled with Dr. Christell Constant on 11/26/2022 at 4:15pm to check on the wound and your progress  Office Information:  -Willia Craze, MD -Phone number: [phone] -Address: [address]       [place], [place] [address]

## 2022-11-03 NOTE — Evaluation (Signed)
Occupational Therapy Evaluation Patient Details Name: Ronald [name]. MRN: [id number] DOB: Jan 08, 1951 Today's Date: 11/03/2022   History of Present Illness 72 yo male s/p L4 L5 S1 laminectomies and foraminotomies 11/02/22 PMH HTN DM HLD CKD R RTC repair   Clinical Impression   Patient evaluated by Occupational Therapy with no further acute OT needs identified. All education has been completed and the patient has no further questions. See below for any follow-up Occupational Therapy or equipment needs. OT to sign off. Thank you for referral.         If plan is discharge home, recommend the following:      Functional Status Assessment  Patient has had a recent decline in their functional status and demonstrates the ability to make significant improvements in function in a reasonable and predictable amount of time.  Equipment Recommendations  None recommended by OT    Recommendations for Other Services       Precautions / Restrictions Precautions Precautions: Back Precaution Comments: handout provided and reviewed for adls Restrictions Weight Bearing Restrictions: No      Mobility Bed Mobility Overal bed mobility: Modified Independent                  Transfers Overall transfer level: Modified independent                        Balance Overall balance assessment: Mild deficits observed, not formally tested                                         ADL either performed or assessed with clinical judgement   ADL Overall ADL's : Modified independent                                       General ADL Comments: educated on dressing and using reacher for LB during session. pt able to void bladder during session. pt reports increased need to void more than normal currently.  Back handout provided and reviewed adls in detail. Pt educated on:  set an alarm at night for medication, avoid sitting for long periods of time,  correct bed positioning for sleeping, correct sequence for bed mobility, avoiding lifting more than 5 pounds and never wash directly over incision. All education is complete and patient indicates understanding.    Vision Baseline Vision/History: 0 No visual deficits Ability to See in Adequate Light: 0 Adequate Patient Visual Report: No change from baseline Vision Assessment?: No apparent visual deficits     Perception Perception: Within Functional Limits       Praxis Praxis: WFL       Pertinent Vitals/Pain Pain Assessment Pain Assessment: 0-10 Pain Score: 6  Pain Location: back Pain Descriptors / Indicators: Discomfort, Grimacing, Operative site guarding Pain Intervention(s): Limited activity within patient's tolerance, Monitored during session, Repositioned, RN gave pain meds during session     Extremity/Trunk Assessment Upper Extremity Assessment Upper Extremity Assessment: Overall WFL for tasks assessed   Lower Extremity Assessment Lower Extremity Assessment: Overall WFL for tasks assessed   Cervical / Trunk Assessment Cervical / Trunk Assessment: Back Surgery   Communication Communication Communication: No apparent difficulties   Cognition Arousal: Alert Behavior During Therapy: WFL for tasks assessed/performed Overall Cognitive Status: Within Functional Limits for tasks assessed  General Comments  incision dry and intact at this time.    Exercises Exercises: Other exercises Other Exercises Other Exercises: completed full flight of stairs without rail alternating steps   Shoulder Instructions      Home Living Family/patient expects to be discharged to:: Private residence Living Arrangements: Spouse/significant other Available Help at Discharge: Family;Available 24 hours/day Type of Home: Mobile home Home Access: Stairs to enter Entrance Stairs-Number of Steps: 6 Entrance Stairs-Rails: Can reach  both Home Layout: One level     Bathroom Shower/Tub: Producer, television/film/video: Handicapped height Bathroom Accessibility: Yes How Accessible: Accessible via walker Home Equipment: Grab bars - toilet;Grab bars - tub/shower;Shower seat;Hand held shower head;BSC/3in1;Cane - single Market researcher (2 wheels);Rollator (4 wheels);Adaptive equipment Adaptive Equipment: Reacher;Sock aid Additional Comments: wife with prior surg so has exposure to AE, has a [place] pig      Prior Functioning/Environment Prior Level of Function : Independent/Modified Independent;Driving                        OT Problem List:        OT Treatment/Interventions:      OT Goals(Current goals can be found in the care plan section) Acute Rehab OT Goals Patient Stated Goal: to go home  OT Frequency:      Co-evaluation              AM-PAC OT "6 Clicks" Daily Activity     Outcome Measure Help from another person eating meals?: None Help from another person taking care of personal grooming?: None Help from another person toileting, which includes using toliet, bedpan, or urinal?: None Help from another person bathing (including washing, rinsing, drying)?: None Help from another person to put on and taking off regular upper body clothing?: None Help from another person to put on and taking off regular lower body clothing?: None 6 Click Score: 24   End of Session Nurse Communication: Mobility status  Activity Tolerance: Patient tolerated treatment well Patient left: in bed;with call bell/phone within reach;with family/visitor present  OT Visit Diagnosis: Unsteadiness on feet (R26.81)                Time: 9798-9211 OT Time Calculation (min): 21 min Charges:  OT General Charges $OT Visit: 1 Visit OT Evaluation $OT Eval Moderate Complexity: 1 Mod   Brynn, OTR/L  Acute Rehabilitation Services Office: [phone] .   Mateo Flow 11/03/2022, 10:10 AM

## 2022-11-03 NOTE — Plan of Care (Signed)
Pt doing well. Pt and wife given D/C instructions with verbal understanding. Rx's were sent to the pharmacy by MD. Pt's incision is clean and dry with no sign of infection. Pt's IV was removed prior to D/C. Pt D/C'd home via wheelchair per MD order. Pt is stable @ D/C and has no other needs at this time. Ashley Allred, RN  

## 2022-11-03 NOTE — Discharge Summary (Addendum)
Orthopedic Surgery Discharge Summary  Patient name: Ronald [name]. Patient MRN: [id number] Admit date: 11/02/2022 Discharge date: 11/03/2022  Attending physician: Willia Craze, MD Final diagnosis: lumbar radiculopathy Findings: L4/5 and L5/S1 hypertrophic facets and thickened ligamentum flavum   Hospital course: Patient is a 72 y.o. male who was admitted after undergoing L4, L5, S1 laminectomies and foraminotomies on 11/02/2022. The patient had significant pain immediately after surgery, but pain eventually was controlled with a multimodal regimen including oxycodone. Labs during the hospitalization did not reveal any significant blood loss as his hemoglobin remained within normal limits. There was no significant electrolyte abnormalities on his BMP. The patient worked with physical therapy who recommended discharge to home. The patient was tolerating an oral diet without issue and was voiding spontaneously after surgery. The patient's vitals were stable on the day of discharge. The patient's drains were removed on the day of discharge. The patient was medically ready for discharge and was discharge to home on post-operative day 1.  Instructions:   Orthopedic Surgery Discharge Instructions  Patient name: Ronald [name]. Procedure Performed: L4-S1 laminectomies and foraminotomies Date of Surgery: 11/02/2022 Surgeon: Willia Craze, MD  Pre-operative Diagnosis: lumbar radiculopathy Post-operative Diagnosis: same as above  Discharge Date: 11/03/2022 Discharged to: home Discharge Condition: stable  Activity: You should refrain from bending, lifting, or twisting with objects greater than ten pounds until three months after surgery. You are encouraged to walk as much as desired. You can perform household activities such as cleaning dishes, doing laundry, vacuuming, etc. as long as the ten-pound restriction is followed. You do not need to wear a brace during the post-operative period.    Incision Care: Your incision site has a dressing over it. That dressing should remain in place and dry at all times for a total of one week after surgery. After one week, you can remove the dressing. Underneath the dressing, you will find pieces of tape. You should leave these pieces of tape in place. They will fall off with time. Do not pick, rub, or scrub at them. Do not put cream or lotion over the surgical area. After one week and once the dressing is off, it is okay to let soap and water run over your incision. Again, do not pick, scrub, or rub at the pieces of tape when bathing. Do not submerge (e.g., take a bath, swim, go in a hot tub, etc.) until six weeks after surgery. There may be some bloody drainage from the incision into the dressing after surgery. This is normal. You do not need to replace the dressing. Continue to leave it in place for the one week as instructed above. Should the dressing become saturated with blood or drainage, please call the office for further instructions.   Medications: You have been prescribed oxycodone. This is a narcotic pain medication and should only be taken as prescribed. You should not drink alcohol or operate heavy machinery (including driving) while taking this medication. The oxycodone can cause constipation as a side effect. For that reason, you have been prescribed senna and miralax. These are both laxatives. You do not need to take this medication if you develop diarrhea. Should you remain constipated even while taking these medications, please increase the dose of miralax to twice daily. Tylenol has been prescribed to be taken every 8 hours, which will give you additional pain relief. Robaxin is a muscle relaxer that has been prescribed to you for muscle spasm type pain. Take this medication as needed.  You can use over-the-counter NSAIDs (ibuprofen, Aleve, Celebrex, naproxen, meloxicam, etc.) for additional pain relief after this surgery. These  medications are safe to take with the Tylenol you have been prescribed. You should not take these medications if you have or have had kidney problems or gastrointestinal ulcers. Take these medications as instructed on the packaging.   In order to set expectations for opioid prescriptions, you will only be prescribed opioids for a total of six weeks after surgery and, at two-weeks after surgery, your opioid prescription will start to tapered (decreased dosage and number of pills). If you have ongoing need for opioid medication six weeks after surgery, you will be referred to pain management. If you are already established with a provider that is giving you opioid medications, you should schedule an appointment with them for six weeks after surgery if you feel you are going to need another prescription. State law only allows for opioid prescriptions one week at a time. If you are running out of opioid medication near the end of the week, please call the office during business hours before running out so I can send you another prescription.   You may resume any home blood thinners (warfarin, lovenox, apixaban, plavix, xarelto, etc) 72 hours after your surgery. Take these medications as they were previously prescribed.  Driving: You should not drive while taking narcotic pain medications. You should start getting back to driving slowly and you may want to try driving in a parking lot before doing anything more.   Diet: You are safe to resume your regular diet after surgery.   Reasons to Call the Office After Surgery: You should feel free to call the office with any concerns or questions you have in the post-operative period, but you should definitely notify the office if you develop: -shortness of breath, chest pain, or trouble breathing -excessive bleeding, drainage, redness, or swelling around the surgical site -fevers, chills, or pain that is getting worse with each passing day -persistent nausea or  vomiting -new weakness in either leg -new or worsening numbness or tingling in either leg -numbness in the groin, bowel or bladder incontinence -other concerns about your surgery  Follow Up Appointments: You have a follow up appointment scheduled with Dr. Christell Constant on 11/26/2022 at 4:15pm to check on the wound and your progress  Office Information:  -Willia Craze, MD -Phone number: [phone] -Address: [address]       [place], [place] [address]

## 2022-11-03 NOTE — Progress Notes (Signed)
Orthopedic Surgery Post-operative Progress Note  Assessment: Patient is a 72 y.o. male who is currently admitted after undergoing L4-S1 laminectomies and foraminotomies   Plan: -Operative plans complete -Drains removed this morning -Out of bed as tolerated, no brace -No bending/lifting/twisting greater than 10 pounds -PT evaluate and treat -Pain control -Diabetic diet -No chemoprophylaxis for dvt or antiplatelets for 72 hours after surgery -Ancef x2 post-operative doses -Anticipate discharge to home today  HTN -continue home amlodipine, losartan, hydrochlorothiazide  DM -sliding scale insulin -diabetic diet -continue home metformin, glipizide, jardiance  HLD -continue home lipitor  BPH -continue home flomax  CKD -creatinine wnl this morning  ___________________________________________________________________________   Subjective: No acute events overnight. Walked the halls yesterday afternoon after surgery. Having back pain but oral medications are controlling it. Not having any radiating leg pain but reports left leg feels a little sore.   Objective:  General: no acute distress, appropriate affect, eating breakfast at edge of bed Neurologic: alert, answering questions appropriately, following commands Respiratory: unlabored breathing on room air Skin: dressing clear/dry/intact, drains with minimal sanguinous output  MSK (spine):  -Strength exam      Right  Left  EHL    5/5  5/5 TA    5/5  5/5 GSC    5/5  5/5 Knee extension  5/5  5/5 Hip flexion   5/5  5/5  -Sensory exam    Sensation intact to light touch in L3-S1 nerve distributions of bilateral lower extremities   Patient name: Ronald [name]. Patient MRN: [id number] Date: 11/03/22

## 2022-11-04 ENCOUNTER — Telehealth: Payer: Self-pay | Admitting: Orthopedic Surgery

## 2022-11-04 NOTE — Telephone Encounter (Signed)
Lmom advised that he can take the dressing off 1 wk postop (11/09/22) as far as the meds he will need to call me back back about it.

## 2022-11-04 NOTE — Telephone Encounter (Signed)
Patient called and said he just had surgery and had questions about the Oxycodone. He also wanted to know when to take the bandage off. He had surgery on lower back. CB#[phone].

## 2022-11-05 NOTE — Telephone Encounter (Signed)
I called and left message to call back and leave detailed message with the front desk about his medication and I will call him back.

## 2022-11-06 ENCOUNTER — Telehealth: Payer: Self-pay | Admitting: Orthopedic Surgery

## 2022-11-06 MED ORDER — OXYCODONE HCL 5 MG PO CAPS: 5.0000 mg | ORAL_CAPSULE | ORAL | 0 refills | Status: AC | PRN

## 2022-11-06 NOTE — Telephone Encounter (Signed)
Dr. Christell Constant sent in meds to his pharm

## 2022-11-06 NOTE — Telephone Encounter (Signed)
Orthopedic Telephone Call  Patient out of pain medications and needs a refill prior to leaving town. Refill was sent to his pharmacy. Tried to call him twice but no answer. Patient already has follow up visit with me in office.   Ronald Sheer, MD Orthopedic Surgeon

## 2022-11-19 ENCOUNTER — Encounter: Payer: BC Managed Care – PPO | Admitting: Orthopedic Surgery

## 2022-11-23 ENCOUNTER — Other Ambulatory Visit: Payer: Self-pay | Admitting: Orthopaedic Surgery

## 2022-11-26 ENCOUNTER — Ambulatory Visit (INDEPENDENT_AMBULATORY_CARE_PROVIDER_SITE_OTHER): Payer: BC Managed Care – PPO | Admitting: Orthopedic Surgery

## 2022-11-26 DIAGNOSIS — Z9889 Other specified postprocedural states: Secondary | ICD-10-CM

## 2022-11-26 MED ORDER — PREGABALIN 75 MG PO CAPS: 75.0000 mg | ORAL_CAPSULE | Freq: Two times a day (BID) | ORAL | 1 refills | Status: DC

## 2022-11-26 NOTE — Progress Notes (Signed)
Orthopedic Surgery Post-operative Office Visit  Procedure: L4-S1 laminectomies and foraminotomies Date of Surgery: 11/02/2022 (~3 weeks post-op)  Assessment: Patient is a 72 y.o. who has had significant improvement in his radiating leg pain since surgery   Plan: -Operative plans complete -Out of bed as tolerated, no brace -No bending/lifting/twisting greater than 10 pounds -Pain management: weaning oxycodone, tylenol -Okay to let soap/water run over the incision, do not submerge -I told him that his bilateral burning toe pain may be neuropathy from diabetes.  Prescribed Lyrica today -Return to office in 4 weeks, x-rays needed at next visit: AP/lateral/flex/ex lumbar  ___________________________________________________________________________   Subjective: Patient has been doing well since surgery.  He reports that his leg pain has improved significantly.  He is not having any pain rating into the right lower extremity.  He still has some pain in the left lateral aspect of the leg.  He rates this pain as a 2 out of 10.  His worst pain is in the big toes bilaterally that he describes as a burning pain.  He has been able to do more activities and has even started mowing his lawn again.  Has not noticed any redness or drainage from his incision.   Objective:  General: no acute distress, appropriate affect Neurologic: alert, answering questions appropriately, following commands Respiratory: unlabored breathing on room air Skin: incision is well-approximated with no erythema, induration, active/expressible drainage  MSK (spine):  -Strength exam      Left  Right  EHL    5/5  5/5 TA    5/5  5/5 GSC    5/5  5/5 Knee extension  5/5  5/5 Hip flexion   5/5  5/5  -Sensory exam    Sensation intact to light touch in L3-S1 nerve distributions of bilateral lower extremities  Imaging: None obtained today   Patient name: Ronald [name]. Patient MRN: [id number] Date of visit:  11/26/22

## 2022-12-18 ENCOUNTER — Telehealth: Payer: Self-pay | Admitting: Orthopedic Surgery

## 2022-12-18 NOTE — Telephone Encounter (Signed)
Pt's wife Stanton Kidney called requesting return to work with no restrictions dated for 10/7. Pt would like to pick note on Monday. Please call pt at [phone].

## 2022-12-22 ENCOUNTER — Encounter: Payer: Self-pay | Admitting: Radiology

## 2022-12-22 NOTE — Telephone Encounter (Signed)
I called and advised note was ready for pick up at the front desk downstairs

## 2022-12-27 ENCOUNTER — Encounter: Payer: Self-pay | Admitting: Cardiovascular Disease

## 2022-12-27 NOTE — Progress Notes (Unsigned)
Cardiology Office Note:    Date:  12/28/2022   ID:  Ronald Brady., DOB [birth year], MRN [id number]  PCP:  Elias Else, MD (Inactive)   [facility] Providers Cardiologist:  Katelind Pytel   Referring MD: No ref. provider found   Chief Complaint  Patient presents with   Heart Murmur        Hypertension         History of Present Illness:   June 22, 2022   Seen with wife , Aahil Gile. is a 72 y.o. 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 They recommended follow-up ultrasound in 5 years.  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  Still uses sea salt   Still does some mowing - has severe pain  Cannot do any weedeating yet  Going to see a doctor    Oct. 7, 2024 Ronald [name] is seen for follow up of his heart murmur, HTN Echocardiogram from July 20, 2022 reveals normal left ventricular systolic function with EF of 60 to 65%.  He has grade 1 diastolic dysfunction.  He has trivial mitral regurgitation, trivial tricuspid regurgitation.  There is mild aortic stenosis with a mean aortic valve gradient of 11 mmHg.  No CP or dyspnea .    Past Medical History:  Diagnosis Date   Carpal tunnel syndrome    Diabetes mellitus without complication (HCC)    Diabetic peripheral neuropathy (HCC)    Diabetic renal disease (HCC)    Elevated hemoglobin (HCC)    Esophageal polyp    GERD (gastroesophageal reflux disease)    Heart murmur    mild AS 07/16/22   HLD (hyperlipidemia)    Hyperglycemia due to type 2 diabetes mellitus (HCC)    Hyperplasia of prostate    Hypertension    Hypertensive retinopathy    Hypogonadism in male    Kidney disease, chronic, stage III (GFR 30-59 ml/min) (HCC)    Rotator cuff tear    Skin cancer    removed  from left arm   Thrombocytosis    Trigger finger     Past Surgical History:  Procedure Laterality Date   CARPAL BOSS EXCISION     CARPAL TUNNEL RELEASE Bilateral    CATARACT EXTRACTION W/ INTRAOCULAR LENS IMPLANT Bilateral    DECOMPRESSIVE LUMBAR LAMINECTOMY LEVEL 1 N/A 11/02/2022   Procedure: L4-5, L5-S1 LAMINECTOMY AND FORAMINOTOMIES;  Surgeon: London Sheer, MD;  Location: [facility] OR;  Service: Orthopedics;  Laterality: N/A;   KNEE SURGERY Bilateral    PARATHYROIDECTOMY Right    right lower removed   ROTATOR CUFF REPAIR Right    ROTATOR CUFF REPAIR Left    and biceps repair    Current Medications: Current Meds  Medication Sig   amLODipine (NORVASC) 5 MG tablet Take 5 mg by mouth daily.   Apoaequorin (PREVAGEN PO) Take 1 capsule by mouth daily.   APPLE CIDER VINEGAR PO Take 1 capsule by mouth in the morning, at noon, and at bedtime.   ascorbic acid (VITAMIN C) 500 MG tablet Take 500 mg by mouth daily.   atorvastatin (LIPITOR) 40 MG tablet Take 40 mg by mouth every evening.   cholecalciferol (VITAMIN D3) 25 MCG (1000  UNIT) tablet Take 1,000 Units by mouth daily.   Chromium-Cinnamon (CINNAMON PLUS CHROMIUM PO) Take 1 capsule by mouth 2 (two) times daily.   glipiZIDE (GLUCOTROL XL) 10 MG 24 hr tablet Take 10 mg by mouth daily.   Homeopathic Products (THERAWORX MUSCLE CRAMPS SPRAY EX) Apply 1 Application topically 2 (two) times daily as needed (leg cramps).   hydrochlorothiazide (HYDRODIURIL) 12.5 MG tablet Take 12.5 mg by mouth every morning.   JARDIANCE 25 MG TABS tablet Take 25 mg by mouth every morning.   losartan (COZAAR) 100 MG tablet Take 100 mg by mouth every morning.   meloxicam (MOBIC) 7.5 MG tablet TAKE 1 TABLET BY MOUTH EVERY EVENING *REFILL REQUEST*   metFORMIN (GLUCOPHAGE-XR) 500 MG 24 hr tablet Take 1,000 mg by mouth 2 (two) times daily with a meal.   metoprolol succinate (TOPROL XL) 25 MG 24 hr tablet Take 1 tablet (25 mg total) by mouth daily. (Patient taking  differently: Take 25 mg by mouth at bedtime.)   Multiple Vitamin (MULTIVITAMIN WITH MINERALS) TABS tablet Take 1 tablet by mouth daily.   oxymetazoline (AFRIN) 0.05 % nasal spray Place 1 spray into both nostrils 2 (two) times daily as needed for congestion.   OZEMPIC, 2 MG/DOSE, 8 MG/3ML SOPN Inject 2 mg into the skin every Friday.   pregabalin (LYRICA) 75 MG capsule Take 1 capsule (75 mg total) by mouth 2 (two) times daily.   tamsulosin (FLOMAX) 0.4 MG CAPS capsule Take 0.4 mg by mouth daily after supper.   Testosterone 1.62 % GEL Apply 3 Pump topically daily.   triamcinolone cream (KENALOG) 0.5 % Apply 1 Application topically daily as needed (eczema).   TURMERIC PO Take 1 capsule by mouth in the morning and at bedtime.     Allergies:   Aspirin and Penicillins   Social History   Socioeconomic History   Marital status: Married    Spouse name: Not on file   Number of children: Not on file   Years of education: Not on file   Highest education level: Not on file  Occupational History   Not on file  Tobacco Use   Smoking status: Never   Smokeless tobacco: Never  Vaping Use   Vaping status: Never Used  Substance and Sexual Activity   Alcohol use: No   Drug use: No   Sexual activity: Not on file  Other Topics Concern   Not on file  Social History Narrative   Not on file   Social Determinants of Health   Financial Resource Strain: Not on file  Food Insecurity: Not on file  Transportation Needs: Not on file  Physical Activity: Not on file  Stress: Not on file  Social Connections: Unknown (08/01/2021)   Received [facility]   Social Network    Social Network: Not on file     Family History: The patient's family history is not on file.  ROS:   Please see the history of present illness.     All other systems reviewed and are negative.  EKGs/Labs/Other Studies Reviewed:    The following studies were reviewed today:   EKG:        Recent  Labs: 11/03/2022: BUN 20; Creatinine, Ser 1.15; Hemoglobin 14.3; Platelets 229; Potassium 3.9; Sodium 134  Recent Lipid Panel No results found for: "CHOL", "TRIG", "HDL", "CHOLHDL", "VLDL", "LDLCALC", "LDLDIRECT"   Risk Assessment/Calculations:            Physical Exam:     Physical  Exam: Blood pressure 112/62, pulse 90, height 5' 4.5" (1.638 m), weight 152 lb 6.4 oz (69.1 kg), SpO2 97%.      GEN:  elderly man.  in no acute distress HEENT: Normal NECK: No JVD; No carotid bruits LYMPHATICS: No lymphadenopathy CARDIAC: RRR soft systolic murmur  RESPIRATORY:  Clear to auscultation without rales, wheezing or rhonchi  ABDOMEN: Soft, non-tender, non-distended MUSCULOSKELETAL:  No edema; No deformity  SKIN: Warm and dry NEUROLOGIC:  Alert and oriented x 3   ASSESSMENT:    1. Aortic valve stenosis, etiology of cardiac valve disease unspecified     PLAN:       Systolic murmur : Echocardiogram reveals mild aortic stenosis.  He has trivial MR and trivial TR.  Overall he is basically asymptomatic.  Will continue to follow him yearly.  2.  HTN:   Blood pressure is well-controlled.  Continue current medications.     Follow up with Dr. Lynnette Caffey in 1 year       Medication Adjustments/Labs and Tests Ordered: Current medicines are reviewed at length with the patient today.  Concerns regarding medicines are outlined above.  No orders of the defined types were placed in this encounter.  No orders of the defined types were placed in this encounter.   Patient Instructions  Medication Instructions:  Your physician recommends that you continue on your current medications as directed. Please refer to the Current Medication list given to you today.  *If you need a refill on your cardiac medications before your next appointment, please call your pharmacy*   Lab Work: none If you have labs (blood work) drawn today and your tests are completely normal, you will receive your  results only by: MyChart Message (if you have MyChart) OR A paper copy in the mail If you have any lab test that is abnormal or we need to change your treatment, we will call you to review the results.   Testing/Procedures: none   Follow-Up: At [facility], you and your health needs are our priority.  As part of our continuing mission to provide you with exceptional heart care, we have created designated Provider Care Teams.  These Care Teams include your primary Cardiologist (physician) and Advanced Practice Providers (APPs -  Physician Assistants and Nurse Practitioners) who all work together to provide you with the care you need, when you need it.  We recommend signing up for the patient portal called "MyChart".  Sign up information is provided on this After Visit Summary.  MyChart is used to connect with patients for Virtual Visits (Telemedicine).  Patients are able to view lab/test results, encounter notes, upcoming appointments, etc.  Non-urgent messages can be sent to your provider as well.   To learn more about what you can do with MyChart, go to [email or web address].    Your next appointment:   12 month(s)  Provider:   Dr Lynnette Caffey  Other Instructions      Signed, Kristeen Miss, MD  12/28/2022 4:25 PM    [facility]

## 2022-12-28 ENCOUNTER — Encounter: Payer: Self-pay | Admitting: Cardiovascular Disease

## 2022-12-28 ENCOUNTER — Ambulatory Visit: Payer: BC Managed Care – PPO | Attending: Cardiovascular Disease | Admitting: Cardiovascular Disease

## 2022-12-28 VITALS — BP 112/62 | HR 90 | Ht 64.5 in | Wt 152.4 lb

## 2022-12-28 DIAGNOSIS — I350 Nonrheumatic aortic (valve) stenosis: Secondary | ICD-10-CM | POA: Diagnosis not present

## 2022-12-28 NOTE — Patient Instructions (Signed)
Medication Instructions:  Your physician recommends that you continue on your current medications as directed. Please refer to the Current Medication list given to you today.  *If you need a refill on your cardiac medications before your next appointment, please call your pharmacy*   Lab Work: none If you have labs (blood work) drawn today and your tests are completely normal, you will receive your results only by: MyChart Message (if you have MyChart) OR A paper copy in the mail If you have any lab test that is abnormal or we need to change your treatment, we will call you to review the results.   Testing/Procedures: none   Follow-Up: At [facility], you and your health needs are our priority.  As part of our continuing mission to provide you with exceptional heart care, we have created designated Provider Care Teams.  These Care Teams include your primary Cardiologist (physician) and Advanced Practice Providers (APPs -  Physician Assistants and Nurse Practitioners) who all work together to provide you with the care you need, when you need it.  We recommend signing up for the patient portal called "MyChart".  Sign up information is provided on this After Visit Summary.  MyChart is used to connect with patients for Virtual Visits (Telemedicine).  Patients are able to view lab/test results, encounter notes, upcoming appointments, etc.  Non-urgent messages can be sent to your provider as well.   To learn more about what you can do with MyChart, go to [email or web address].    Your next appointment:   12 month(s)  Provider:   Dr Lynnette Caffey    Other Instructions

## 2022-12-30 ENCOUNTER — Ambulatory Visit: Payer: BC Managed Care – PPO | Admitting: Orthopedic Surgery

## 2022-12-30 ENCOUNTER — Other Ambulatory Visit (INDEPENDENT_AMBULATORY_CARE_PROVIDER_SITE_OTHER): Payer: BC Managed Care – PPO

## 2022-12-30 DIAGNOSIS — Z9889 Other specified postprocedural states: Secondary | ICD-10-CM | POA: Diagnosis not present

## 2022-12-30 NOTE — Progress Notes (Signed)
Orthopedic Surgery Post-operative Office Visit   Procedure: L4-S1 laminectomies and foraminotomies Date of Surgery: 11/02/2022 (~8 weeks post-op)   Assessment: Patient is a 72 y.o. who has been doing well since surgery.  Has complete relief of his right leg symptoms, still with 2/10 left leg pain but Lyrica helping     Plan: -Operative plans complete -Out of bed as tolerated, no brace -No spine specific precautions -Pain management: OTC medications -Okay to submerge wound -Lyrica has been helping, will refill if he needs more -Wanted to do do 4 week follow up. Patient has had difficulty with co-pays and is switching insurance on January 1 so he wanted to do his next follow up then, x-rays needed at next visit: AP/lateral/flex/ex lumbar   ___________________________________________________________________________     Subjective: Patient has been doing well since he was last seen.  He has been working.  He is no longer having any right leg symptoms.  He does have some left leg pain along the lateral aspect of the thigh and leg.  He rates it as a 2 out of 10.  He has not been limited in his activities as well as this pain.  He also has noted some stiffness in his back when he gets up from a seated position if he had been sitting for a while.  Otherwise, he is not having any issues with his lower back.  He has been taking Lyrica and has found that helpful with the leg pain.  He has not noticed any redness or drainage around his incision.     Objective:   General: no acute distress, appropriate affect Neurologic: alert, answering questions appropriately, following commands Respiratory: unlabored breathing on room air Skin: incision is well-healed with no erythema, induration, active/expressible drainage   MSK (spine):   -Strength exam                                                   Left                  Right   EHL                              5/5                  5/5 TA                                  5/5                  5/5 GSC                             5/5                  5/5 Knee extension            5/5                  5/5 Hip flexion                    5/5  5/5   -Sensory exam                           Sensation intact to light touch in L3-S1 nerve distributions of bilateral lower extremities   Imaging: XRs of the lumbar spine from 12/30/2022 were independently reviewed and interpreted, showing multiple levels with disc height loss but most significant at L5/S1.  Laminectomy defect seen from L4-S1.  No fracture or dislocation seen.  No evidence of instability on flexion/extension views.     Patient name: Ronald [name]. Patient MRN: [id number] Date of visit: 12/30/22

## 2023-01-08 ENCOUNTER — Telehealth: Payer: Self-pay | Admitting: Orthopedic Surgery

## 2023-01-14 NOTE — Telephone Encounter (Signed)
error 

## 2023-01-28 ENCOUNTER — Other Ambulatory Visit: Payer: Self-pay | Admitting: Orthopedic Surgery

## 2023-02-04 ENCOUNTER — Ambulatory Visit: Payer: BC Managed Care – PPO | Admitting: Physician Assistant

## 2023-02-05 ENCOUNTER — Encounter: Payer: Self-pay | Admitting: Orthopaedic Surgery

## 2023-02-05 ENCOUNTER — Other Ambulatory Visit (INDEPENDENT_AMBULATORY_CARE_PROVIDER_SITE_OTHER): Payer: BC Managed Care – PPO

## 2023-02-05 ENCOUNTER — Ambulatory Visit (INDEPENDENT_AMBULATORY_CARE_PROVIDER_SITE_OTHER): Payer: BC Managed Care – PPO | Admitting: Orthopaedic Surgery

## 2023-02-05 VITALS — BP 112/64 | HR 77 | Ht 64.5 in | Wt 154.0 lb

## 2023-02-05 DIAGNOSIS — M25511 Pain in right shoulder: Secondary | ICD-10-CM

## 2023-02-05 DIAGNOSIS — S46211A Strain of muscle, fascia and tendon of other parts of biceps, right arm, initial encounter: Secondary | ICD-10-CM | POA: Insufficient documentation

## 2023-02-05 NOTE — Progress Notes (Signed)
Office Visit Note   Patient: Ronald [name].           Date of Birth: 10-16-1950           MRN: [id number] Visit Date: 02/05/2023              Requested by: Aliene Beams, MD [phone] [address] [place],  [place] [address] PCP: Elias Else, MD (Inactive)   Assessment & Plan: Visit Diagnoses:  1. Acute pain of right shoulder   2. Biceps tendon rupture, proximal, right, initial encounter     Plan: Pathophysiology of long head biceps tendon rupture discussed in detail.  He has no high riding of the head and had single rotator cuff anchor with his repair in 2005.  He will avoid extreme pulling for another month or so and then can resume normal activities.  At this point I discussed them no operative intervention is recommended.  Follow-Up Instructions: Return if symptoms worsen or fail to improve.   Orders:  Orders Placed This Encounter  Procedures   XR Shoulder Right   No orders of the defined types were placed in this encounter.     Procedures: No procedures performed   Clinical Data: No additional findings.   Subjective: Chief Complaint  Patient presents with   Right Upper Arm - Pain    HPI 72 year old male long-term patient of mine who had SLAP tear with rotator cuff repair and labral repair by me almost 20 years ago in 2005.  He states he was pulling on the headrest in the car having to jerk on it suddenly had a large sharp pop and noticed distal migration of the biceps muscle with some ecchymosis that developed over a few days.  He had sharp pain when he tried raise the window last week.  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  He is left-hand dominant.  No pain with pushing.  Denies associated neck symptoms.  He is just over 3 months out from two-level decompression by Dr. Christell Constant which is doing well.  Review of Systems all other systems noncontributory to HPI.   Objective: Vital Signs: BP 112/64   Pulse 77   Ht 5' 4.5"  (1.638 m)   Wt 154 lb (69.9 kg)   BMI 26.03 kg/m   Physical Exam Constitutional:      Appearance: He is well-developed.  HENT:     Head: Normocephalic and atraumatic.     Right Ear: External ear normal.     Left Ear: External ear normal.  Eyes:     Pupils: Pupils are equal, round, and reactive to light.  Neck:     Thyroid: No thyromegaly.     Trachea: No tracheal deviation.  Cardiovascular:     Rate and Rhythm: Normal rate.  Pulmonary:     Effort: Pulmonary effort is normal.     Breath sounds: No wheezing.  Abdominal:     General: Bowel sounds are normal.     Palpations: Abdomen is soft.  Musculoskeletal:     Cervical back: Neck supple.  Skin:    General: Skin is warm and dry.     Capillary Refill: Capillary refill takes less than 2 seconds.  Neurological:     Mental Status: He is alert and oriented to person, place, and time.  Psychiatric:        Behavior: Behavior normal.        Thought Content: Thought content normal.  Judgment: Judgment normal.     Ortho Exam there is distal prominence of the biceps tendon.  He has full elbow extension and fairly good strength with resisted flexion.  Short of the biceps is intact.  Specialty Comments:  No specialty comments available.  Imaging: XR Shoulder Right  Result Date: 02/05/2023 3 views right shoulder obtained and reviewed.  This shows single metal ultra fix anchor in the humeral head greater tuberosity.  No glenohumeral arthritis acromioclavicular joint is normal mild changes from previous acromioplasty noted. Impression: Post rotator cuff repair otherwise normal shoulder radiographs.    PMFS History: Patient Active Problem List   Diagnosis Date Noted   Biceps tendon rupture, proximal, right, initial encounter 02/05/2023   Aortic stenosis 12/28/2022   Lumbar radiculopathy 11/02/2022   Protrusion of lumbar intervertebral disc 05/22/2022   Hip strain, left, initial encounter 10/06/2019   Trigger finger, left  middle finger 03/28/2019   Contusion of finger of right hand 03/28/2019   Other intervertebral disc degeneration, lumbar region 03/28/2019   Peroneal tendinitis, right leg 02/19/2019   Trigger thumb, left thumb 06/10/2017   Chronic left shoulder pain 10/08/2016   Neck pain 10/08/2016   Past Medical History:  Diagnosis Date   Carpal tunnel syndrome    Diabetes mellitus without complication (HCC)    Diabetic peripheral neuropathy (HCC)    Diabetic renal disease (HCC)    Elevated hemoglobin (HCC)    Esophageal polyp    GERD (gastroesophageal reflux disease)    Heart murmur    mild AS 07/16/22   HLD (hyperlipidemia)    Hyperglycemia due to type 2 diabetes mellitus (HCC)    Hyperplasia of prostate    Hypertension    Hypertensive retinopathy    Hypogonadism in male    Kidney disease, chronic, stage III (GFR 30-59 ml/min) (HCC)    Rotator cuff tear    Skin cancer    removed from left arm   Thrombocytosis    Trigger finger     No family history on file.  Past Surgical History:  Procedure Laterality Date   CARPAL BOSS EXCISION     CARPAL TUNNEL RELEASE Bilateral    CATARACT EXTRACTION W/ INTRAOCULAR LENS IMPLANT Bilateral    DECOMPRESSIVE LUMBAR LAMINECTOMY LEVEL 1 N/A 11/02/2022   Procedure: L4-5, L5-S1 LAMINECTOMY AND FORAMINOTOMIES;  Surgeon: London Sheer, MD;  Location: [facility] OR;  Service: Orthopedics;  Laterality: N/A;   KNEE SURGERY Bilateral    PARATHYROIDECTOMY Right    right lower removed   ROTATOR CUFF REPAIR Right    ROTATOR CUFF REPAIR Left    and biceps repair   Social History   Occupational History   Not on file  Tobacco Use   Smoking status: Never   Smokeless tobacco: Never  Vaping Use   Vaping status: Never Used  Substance and Sexual Activity   Alcohol use: No   Drug use: No   Sexual activity: Not on file

## 2023-04-05 ENCOUNTER — Other Ambulatory Visit: Payer: Self-pay | Admitting: Orthopedic Surgery

## 2023-06-08 ENCOUNTER — Other Ambulatory Visit: Payer: Self-pay | Admitting: Orthopedic Surgery

## 2023-07-15 ENCOUNTER — Other Ambulatory Visit: Payer: Self-pay | Admitting: Cardiovascular Disease

## 2023-07-15 DIAGNOSIS — R011 Cardiac murmur, unspecified: Secondary | ICD-10-CM

## 2023-08-05 ENCOUNTER — Other Ambulatory Visit (INDEPENDENT_AMBULATORY_CARE_PROVIDER_SITE_OTHER): Payer: Self-pay

## 2023-08-05 ENCOUNTER — Ambulatory Visit: Admitting: Orthopedic Surgery

## 2023-08-05 DIAGNOSIS — Z9889 Other specified postprocedural states: Secondary | ICD-10-CM

## 2023-08-05 DIAGNOSIS — M5416 Radiculopathy, lumbar region: Secondary | ICD-10-CM | POA: Diagnosis not present

## 2023-08-05 MED ORDER — TRAMADOL HCL 50 MG PO TABS
50.0000 mg | ORAL_TABLET | Freq: Four times a day (QID) | ORAL | 0 refills | Status: AC | PRN
Start: 2023-08-05 — End: 2023-08-10

## 2023-08-05 NOTE — Progress Notes (Signed)
 Orthopedic Surgery Post-operative Office Visit   Procedure: L4-S1 laminectomies and foraminotomies Date of Surgery: 11/02/2022 (~9 months post-op)   Assessment: Patient is a 73 y.o. who had been doing well postoperatively but now has developed left groin pain and left lateral thigh pain.  His groin pain is worse with rotation of the hip and seems consistent with hip is etiology.  This would not explain his radiating left thigh pain that appears to be in an L5 distribution.     Plan: -Patient seems to have 2 problems and so recommended 2 different injections.  The first would be a intra-articular injection to the left hip.  The second would be an L5 transforaminal injection to help with his radicular type symptoms -He is already taking Tylenol  and Lyrica .  He was told by his primary not to use any NSAIDs -Showed him his imaging studies and how he has had further degeneration at his L5/S1 disc that has likely resulted in further foraminal stenosis -Prescribed tramadol  for additional pain relief -Follow-up in 2 months, x-rays at next visit: None   ___________________________________________________________________________     Subjective: Patient had been doing well after surgery but then in late March 2025, he noted onset of pain.  He said he was building a deck around that time with his son.  He is been feeling pain in the left groin and then eventually pain was felt in his back and along the lateral aspect of his left thigh.  He notes the pain on a daily basis.  He feels the back and leg pain with activity and even at rest.  The groin pain is more related to activity particularly weightbearing activity.  He has been taking the Lyrica  which previously been helpful for some of his residual pain after surgery but is not providing him with adequate relief right now.     Objective:   General: no acute distress, appropriate affect Neurologic: alert, answering questions appropriately, following  commands Respiratory: unlabored breathing on room air Skin: incision is well-healed   MSK (spine):   -Strength exam                                                   Left                  Right   EHL                              5/5                  5/5 TA                                 5/5                  5/5 GSC                             5/5                  5/5 Knee extension            5/5  5/5 Hip flexion                    5/5                  5/5   -Sensory exam                           Sensation intact to light touch in L3-S1 nerve distributions of bilateral lower extremities  -Left hip exam: Positive FADIR, positive Stinchfield, pain with motion at the hip joint, negative FABER    Imaging: XRs of the lumbar spine from 08/05/2023 were independently reviewed interpreted, showing interval disc height loss at L5/S1 with vacuum disc phenomenon.  Disc height loss at L4/5.  Laminectomy defect seen from L4-S1.  No fracture or dislocation seen.  No evidence of instability on flexion/extension views.     Patient name: Ronald [name]. Patient MRN: [id number] Date of visit: 08/05/23

## 2023-08-10 ENCOUNTER — Telehealth: Payer: Self-pay | Admitting: Sports Medicine

## 2023-08-10 NOTE — Telephone Encounter (Signed)
 Pt's wife Ronald [name] called requesting a call back concerning upcoming injection. She states want to know if pt can return to work after injection for the day or stay ome to rest after injection. Please call pt's wife back concerning this matter at [phone].

## 2023-08-10 NOTE — Telephone Encounter (Signed)
 Called patient's wife back. Advised that Dr. Vaughn Georges typically recommends his patients take it easy for two days after the injection, and that he can provide a work note if needed. 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 Also advised that patient will be okay to drive after the injection.

## 2023-08-13 ENCOUNTER — Other Ambulatory Visit: Payer: Self-pay

## 2023-08-13 ENCOUNTER — Encounter: Payer: Self-pay | Admitting: Sports Medicine

## 2023-08-13 ENCOUNTER — Ambulatory Visit: Admitting: Sports Medicine

## 2023-08-13 DIAGNOSIS — M25552 Pain in left hip: Secondary | ICD-10-CM

## 2023-08-13 DIAGNOSIS — M1612 Unilateral primary osteoarthritis, left hip: Secondary | ICD-10-CM | POA: Diagnosis not present

## 2023-08-13 MED ORDER — LIDOCAINE HCL 1 % IJ SOLN
4.0000 mL | INTRAMUSCULAR | Status: AC | PRN
Start: 2023-08-13 — End: 2023-08-13
  Administered 2023-08-13: 4 mL

## 2023-08-13 MED ORDER — METHYLPREDNISOLONE ACETATE 40 MG/ML IJ SUSP
80.0000 mg | INTRAMUSCULAR | Status: AC | PRN
Start: 2023-08-13 — End: 2023-08-13
  Administered 2023-08-13: 80 mg via INTRA_ARTICULAR

## 2023-08-13 NOTE — Progress Notes (Signed)
   Procedure Note  Patient: Ronald [name].             Date of Birth: [birth year]           MRN: [id number]             Visit Date: 08/13/2023  Procedures: Visit Diagnoses:  1. Unilateral primary osteoarthritis, left hip   2. Pain in left hip    Large Joint Inj: L hip joint on 08/13/2023 10:55 AM Indications: pain Details: 22 G 3.5 in needle, ultrasound-guided anterior approach Medications: 4 mL lidocaine  1 %; 80 mg methylPREDNISolone  acetate 40 MG/ML Outcome: tolerated well, no immediate complications  Procedure: US -guided intra-articular hip injection, Left After discussion on risks/benefits/indications and informed verbal consent was obtained, a timeout was performed. Patient was lying supine on exam table. The hip was cleaned with betadine  and alcohol swabs . Then utilizing ultrasound guidance, the patient's femoral head and neck junction was identified and subsequently injected with 4:2 lidocaine :depomedrol via an in-plane approach with ultrasound visualization of the injectate administered into the hip joint. Patient tolerated procedure well without immediate complications.  Procedure, treatment alternatives, risks and benefits explained, specific risks discussed. Consent was given by the patient. Immediately prior to procedure a time out was called to verify the correct patient, procedure, equipment, support staff and site/side marked as required. Patient was prepped and draped in the usual sterile fashion.     - patient tolerated procedure well, discussed post-injection protocol and for him to pay attention to response to injection/relief - has injection with Dr. Daisey Dryer for lumbar ESI on 08/24/23 - follow-up with Dr. Sulema Endo as indicated; I am happy to see them as needed  Shauna Del, DO Primary Care Sports Medicine Physician  [facility] - Orthopedics  This note was dictated using Dragon naturally speaking software and may contain errors in syntax, spelling, or  content which have not been identified prior to signing this note.

## 2023-08-24 ENCOUNTER — Other Ambulatory Visit: Payer: Self-pay

## 2023-08-24 ENCOUNTER — Ambulatory Visit: Admitting: Physical Medicine and Rehabilitation

## 2023-08-24 VITALS — BP 119/65 | HR 92

## 2023-08-24 DIAGNOSIS — M961 Postlaminectomy syndrome, not elsewhere classified: Secondary | ICD-10-CM

## 2023-08-24 DIAGNOSIS — M5416 Radiculopathy, lumbar region: Secondary | ICD-10-CM

## 2023-08-24 MED ORDER — METHYLPREDNISOLONE ACETATE 40 MG/ML IJ SUSP
40.0000 mg | Freq: Once | INTRAMUSCULAR | Status: AC
  Administered 2023-08-24: 40 mg

## 2023-08-24 NOTE — Patient Instructions (Signed)

## 2023-08-24 NOTE — Progress Notes (Unsigned)
 Pain Scale   Average Pain 3 Patient advising he has lower back pain and advised that he has some issues  walking up and down stairs.        +Driver, -BT, -Dye Allergies.

## 2023-08-26 NOTE — Progress Notes (Signed)
 Ronald [name]. - 73 y.o. male MRN [id number]  Date of birth: 06-12-1950  Office Visit Note: Visit Date: 08/24/2023 PCP: Vidal Graven, MD Referred by: Diedra Fowler, MD  Subjective: Chief Complaint  Patient presents with   Lower Back - Pain   HPI:  Ronald [name]. is a 73 y.o. male who comes in today at the request of Dr. Colette Davies for planned Left L5-S1 Lumbar Transforaminal epidural steroid injection with fluoroscopic guidance.  The patient has failed conservative care including home exercise, medications, time and activity modification.  This injection will be diagnostic and hopefully therapeutic.  Please see requesting physician notes for further details and justification.  As of note, Dr. Sulema Endo has been trying to figure out much pain this gentleman is having from a hip pathology.  His recent injection in the hip with ultrasound guidance by Dr. Vaughn Georges seem to help some but the patient is a very hard historian.  On the one hand he says he is hurting worse and still sore from the injection and that on the other hand he says he is moving better.  His biggest complaint is left anterior proximal thigh and groin pain.  It is worse with getting in and out of the car and worse with rotation.   ROS Otherwise per HPI.  Assessment & Plan: Visit Diagnoses:    ICD-10-CM   1. Radiculopathy, lumbar region  M54.16 XR C-ARM NO REPORT    Epidural Steroid injection    methylPREDNISolone  acetate (DEPO-MEDROL ) injection 40 mg    2. Post laminectomy syndrome  M96.1       Plan: No additional findings.   Meds & Orders:  Meds ordered this encounter  Medications   methylPREDNISolone  acetate (DEPO-MEDROL ) injection 40 mg    Orders Placed This Encounter  Procedures   XR C-ARM NO REPORT   Epidural Steroid injection    Follow-up: Return for visit to requesting provider as needed.   Procedures: No procedures performed  Lumbosacral Transforaminal Epidural Steroid Injection -  Sub-Pedicular Approach with Fluoroscopic Guidance  Patient: Ronald [name].      Date of Birth: 08-04-1950 MRN: [id number] PCP: Vidal Graven, MD      Visit Date: 08/24/2023   Universal Protocol:    Date/Time: 08/24/2023  Consent Given By: the patient  Position: PRONE  Additional Comments: Vital signs were monitored before and after the procedure. Patient was prepped and draped in the usual sterile fashion. The correct patient, procedure, and site was verified.   Injection Procedure Details:   Procedure diagnoses: Radiculopathy, lumbar region [M54.16]    Meds Administered:  Meds ordered this encounter  Medications   methylPREDNISolone  acetate (DEPO-MEDROL ) injection 40 mg    Laterality: Left  Location/Site: L5  Needle:5.0 in., 22 ga.  Short bevel or Quincke spinal needle  Needle Placement: Transforaminal  Findings:    -Comments: Excellent flow of contrast along the nerve, nerve root and into the epidural space.  Procedure Details: After squaring off the end-plates to get a true AP view, the C-arm was positioned so that an oblique view of the foramen as noted above was visualized. The target area is just inferior to the "nose of the scotty dog" or sub pedicular. The soft tissues overlying this structure were infiltrated with 2-3 ml. of 1% Lidocaine  without Epinephrine.  The spinal needle was inserted toward the target using a "trajectory" view along the fluoroscope beam.  Under AP and lateral visualization, the needle was advanced so  it did not puncture dura and was located close the 6 O'Clock position of the pedical in AP tracterory. Biplanar projections were used to confirm position. Aspiration was confirmed to be negative for CSF and/or blood. A 1-2 ml. volume of Isovue-250 was injected and flow of contrast was noted at each level. Radiographs were obtained for documentation purposes.   After attaining the desired flow of contrast documented above, a 0.5 to 1.0  ml test dose of 0.25% Marcaine  was injected into each respective transforaminal space.  The patient was observed for 90 seconds post injection.  After no sensory deficits were reported, and normal lower extremity motor function was noted,   the above injectate was administered so that equal amounts of the injectate were placed at each foramen (level) into the transforaminal epidural space.   Additional Comments:  The patient tolerated the procedure well Dressing: 2 x 2 sterile gauze and Band-Aid    Post-procedure details: Patient was observed during the procedure. Post-procedure instructions were reviewed.  Patient left the clinic in stable condition.    Clinical History: CLINICAL DATA:  Low back pain, symptoms persist with > 6 wks treatment   EXAM: MRI LUMBAR SPINE WITHOUT CONTRAST   TECHNIQUE: Multiplanar, multisequence MR imaging of the lumbar spine was performed. No intravenous contrast was administered.   COMPARISON:  None Available.   FINDINGS: Segmentation:  Standard.   Alignment:  Normal.   Vertebrae: Right eccentric degenerative/discogenic endplate signal changes at L5-S1. No specific evidence of acute fracture discitis/osteomyelitis. Heterogeneous marrow without suspicious lesion.   Conus medullaris and cauda equina: Conus extends to the L1-L2 level. Conus appears normal.   Paraspinal and other soft tissues: Unremarkable.   Disc levels:   T12-L1: No significant disc protrusion, foraminal stenosis, or canal stenosis.   L1-L2: No significant disc protrusion, foraminal stenosis, or canal stenosis.   L2-L3: Disc bulging.  Facet arthropathy.  No significant stenosis.   L3-L4: Disc bulging. Facet arthropathy. Mild right foraminal stenosis with right far lateral/extraforaminal disc closely approximating the exiting/exited right L3 nerve.   L4-L5: Broad disc bulge. Bilateral facet arthropathy. Moderate left foraminal stenosis with left far  lateral/extraforaminal disc closely approximating the exiting left L4 nerve. Ligamentum flavum thickening. Mild canal stenosis.   L5-S1: Disc bulging and bilateral facet arthropathy. Moderate bilateral foraminal stenosis. Patent canal.   IMPRESSION: 1. At L5-S1, moderate bilateral foraminal stenosis. 2. At L4-L5, moderate left foraminal stenosis with left far lateral/extraforaminal disc closely approximating the exiting left L4 nerve. Mild canal stenosis. 3. At L3-L4, mild right foraminal stenosis with right far lateral/extraforaminal disc closely approximating the exiting/exited right L3 nerve.     Electronically Signed   By: Stevenson Elbe M.D.   On: 05/19/2022 08:50     Objective:  VS:  HT:    WT:   BMI:     BP:119/65  HR:92bpm  TEMP: ( )  RESP:  Physical Exam Vitals and nursing note reviewed.  Constitutional:      General: He is not in acute distress.    Appearance: Normal appearance. He is not ill-appearing.  HENT:     Head: Normocephalic and atraumatic.     Right Ear: External ear normal.     Left Ear: External ear normal.     Nose: No congestion.  Eyes:     Extraocular Movements: Extraocular movements intact.  Cardiovascular:     Rate and Rhythm: Normal rate.     Pulses: Normal pulses.  Pulmonary:     Effort: Pulmonary  effort is normal. No respiratory distress.  Abdominal:     General: There is no distension.     Palpations: Abdomen is soft.  Musculoskeletal:        General: No tenderness or signs of injury.     Cervical back: Neck supple.     Right lower leg: No edema.     Left lower leg: No edema.     Comments: Patient has good distal strength without clonus.  Skin:    Findings: No erythema or rash.  Neurological:     General: No focal deficit present.     Mental Status: He is alert and oriented to person, place, and time.     Sensory: No sensory deficit.     Motor: No weakness or abnormal muscle tone.     Coordination: Coordination normal.   Psychiatric:        Mood and Affect: Mood normal.        Behavior: Behavior normal.      Imaging: No results found.

## 2023-08-26 NOTE — Procedures (Signed)
 Lumbosacral Transforaminal Epidural Steroid Injection - Sub-Pedicular Approach with Fluoroscopic Guidance  Patient: Ronald [name].      Date of Birth: 1950/05/29 MRN: [id number] PCP: Vidal Graven, MD      Visit Date: 08/24/2023   Universal Protocol:    Date/Time: 08/24/2023  Consent Given By: the patient  Position: PRONE  Additional Comments: Vital signs were monitored before and after the procedure. Patient was prepped and draped in the usual sterile fashion. The correct patient, procedure, and site was verified.   Injection Procedure Details:   Procedure diagnoses: Radiculopathy, lumbar region [M54.16]    Meds Administered:  Meds ordered this encounter  Medications   methylPREDNISolone  acetate (DEPO-MEDROL ) injection 40 mg    Laterality: Left  Location/Site: L5  Needle:5.0 in., 22 ga.  Short bevel or Quincke spinal needle  Needle Placement: Transforaminal  Findings:    -Comments: Excellent flow of contrast along the nerve, nerve root and into the epidural space.  Procedure Details: After squaring off the end-plates to get a true AP view, the C-arm was positioned so that an oblique view of the foramen as noted above was visualized. The target area is just inferior to the "nose of the scotty dog" or sub pedicular. The soft tissues overlying this structure were infiltrated with 2-3 ml. of 1% Lidocaine  without Epinephrine.  The spinal needle was inserted toward the target using a "trajectory" view along the fluoroscope beam.  Under AP and lateral visualization, the needle was advanced so it did not puncture dura and was located close the 6 O'Clock position of the pedical in AP tracterory. Biplanar projections were used to confirm position. Aspiration was confirmed to be negative for CSF and/or blood. A 1-2 ml. volume of Isovue-250 was injected and flow of contrast was noted at each level. Radiographs were obtained for documentation purposes.   After attaining the  desired flow of contrast documented above, a 0.5 to 1.0 ml test dose of 0.25% Marcaine  was injected into each respective transforaminal space.  The patient was observed for 90 seconds post injection.  After no sensory deficits were reported, and normal lower extremity motor function was noted,   the above injectate was administered so that equal amounts of the injectate were placed at each foramen (level) into the transforaminal epidural space.   Additional Comments:  The patient tolerated the procedure well Dressing: 2 x 2 sterile gauze and Band-Aid    Post-procedure details: Patient was observed during the procedure. Post-procedure instructions were reviewed.  Patient left the clinic in stable condition.

## 2023-09-01 ENCOUNTER — Other Ambulatory Visit: Payer: Self-pay | Admitting: Orthopedic Surgery

## 2023-09-20 ENCOUNTER — Ambulatory Visit: Admitting: Orthopedic Surgery

## 2023-09-20 DIAGNOSIS — M25552 Pain in left hip: Secondary | ICD-10-CM | POA: Diagnosis not present

## 2023-09-20 MED ORDER — PREGABALIN 25 MG PO CAPS
25.0000 mg | ORAL_CAPSULE | Freq: Two times a day (BID) | ORAL | 0 refills | Status: DC
Start: 2023-09-20 — End: 2023-11-03

## 2023-09-20 NOTE — Progress Notes (Signed)
 Orthopedic Surgery Post-operative Office Visit   Procedure: L4-S1 laminectomies and foraminotomies Date of Surgery: 11/02/2022 (~10 months post-op)   Assessment: Patient is a 73 y.o. who had been doing well postoperatively but now is dealing with left groin pain worse with weight bearing and left lateral thigh pain in a L5 distribution     Plan: -Patient has tried tylenol , lyrica , tramadol , left hip injection, lumbar steroid injection -Patient has tried tylenol , tramadol , and injections over the last six weeks for his groin pain without any relief so recommended a MRI of the left hip to evaluate for labral pathology -He has not found significant relief of his radicular lateral thigh pain with his current dose of lyrica  so prescribed him 25mg  tablets so he can take 100mg  BID. If he continues to have pain and is not having side effects, could increase at our next visit -Talked about PT, pain management, and surgery as additional options for his radiating leg pain. He wants to try the increased dose of lyrica  -Follow-up in 4 weeks, x-rays at next visit: none   ___________________________________________________________________________     Subjective: Patient continues to have left groin and left lateral thigh pain. He states he has the thigh pain on a daily basis with activity and at rest. He also has been having left groin pain that is felt with weight bearing. He has been using a cane to try to help with this pain. He has kept at his current dose of lyrica  without any significant relief. He also tried tramadol  but that kept him up at night so he stopped taking it. He also got injections after our last visit. He felt the lumbar injection was helpful but it only lasted a week or so then pain returned.      Objective:   General: no acute distress, appropriate affect Neurologic: alert, answering questions appropriately, following commands Respiratory: unlabored breathing on room air Skin:  incision is well-healed   MSK (spine):   -Strength exam                                                   Left                  Right   EHL                              5/5                  5/5 TA                                 5/5                  5/5 GSC                             5/5                  5/5 Knee extension            5/5                  5/5 Hip flexion  5/5                  5/5   -Sensory exam                           Sensation intact to light touch in L3-S1 nerve distributions of bilateral lower extremities   -Left hip exam: Positive FADIR, positive Stinchfield, pain with motion at the hip joint, negative FABER     Imaging: XRs of the lumbar spine from 08/05/2023 were previously independently reviewed interpreted, showing interval disc height loss at L5/S1 with vacuum disc phenomenon.  Disc height loss at L4/5.  Laminectomy defect seen from L4-S1.  No fracture or dislocation seen.  No evidence of instability on flexion/extension views.     Patient name: Ronald [name]. Patient MRN: [id number] Date of visit: 09/20/23

## 2023-09-30 ENCOUNTER — Telehealth: Payer: Self-pay | Admitting: Orthopedic Surgery

## 2023-10-01 ENCOUNTER — Encounter: Payer: Self-pay | Admitting: Orthopedic Surgery

## 2023-10-04 ENCOUNTER — Telehealth: Payer: Self-pay | Admitting: Orthopedic Surgery

## 2023-10-04 ENCOUNTER — Ambulatory Visit: Admitting: Orthopedic Surgery

## 2023-10-04 NOTE — Telephone Encounter (Signed)
 Pt's spouse Adrien states the medication pregabalin  is not helping as they expected it would and also the mri is scheduled for 10/06/23

## 2023-10-04 NOTE — Telephone Encounter (Signed)
 I called, Ronald [name] states that he has noticed some improvement but was not a lot, so he will continue to taking it. He will have the MRI done and I will look for an appointment sooner than 10/21/23.

## 2023-10-06 ENCOUNTER — Ambulatory Visit
Admission: RE | Admit: 2023-10-06 | Discharge: 2023-10-06 | Disposition: A | Discharge status: Home / Self Care | Source: Ambulatory Visit | Attending: Orthopedic Surgery | Admitting: Orthopedic Surgery

## 2023-10-06 DIAGNOSIS — M25552 Pain in left hip: Secondary | ICD-10-CM

## 2023-10-11 ENCOUNTER — Other Ambulatory Visit: Payer: Self-pay | Admitting: Orthopedic Surgery

## 2023-10-11 DIAGNOSIS — C4922 Malignant neoplasm of connective and soft tissue of left lower limb, including hip: Secondary | ICD-10-CM

## 2023-10-11 MED ORDER — HYDROCODONE-ACETAMINOPHEN 5-325 MG PO TABS
1.0000 | ORAL_TABLET | Freq: Four times a day (QID) | ORAL | 0 refills | Status: AC | PRN
Start: 2023-10-11 — End: 2023-10-16

## 2023-10-11 NOTE — Progress Notes (Signed)
 Orthopedic Note   Called and spoke with patient this evening over the phone.  I went over his MRI results with him.  I do see over the posterior aspect of the left hip outside of the joint space, a cystic structure with heterogeneous signal.  This could represent a malignant lesion.  Accordingly, recommended MRI with and without contrast and a CT scan to evaluate further.  Both those imaging studies were ordered today.  Will call him with the results to decide on the next steps in treatment.   Ozell DELENA Ada, MD Orthopedic Surgeon

## 2023-10-12 ENCOUNTER — Other Ambulatory Visit

## 2023-10-14 ENCOUNTER — Ambulatory Visit (HOSPITAL_COMMUNITY)

## 2023-10-14 ENCOUNTER — Ambulatory Visit
Admission: RE | Admit: 2023-10-14 | Discharge: 2023-10-14 | Disposition: A | Discharge status: Home / Self Care | Source: Ambulatory Visit | Attending: Orthopedic Surgery

## 2023-10-14 ENCOUNTER — Ambulatory Visit
Admission: RE | Admit: 2023-10-14 | Discharge: 2023-10-14 | Disposition: A | Discharge status: Home / Self Care | Source: Ambulatory Visit | Attending: Orthopedic Surgery | Admitting: Orthopedic Surgery

## 2023-10-14 DIAGNOSIS — C4922 Malignant neoplasm of connective and soft tissue of left lower limb, including hip: Secondary | ICD-10-CM | POA: Insufficient documentation

## 2023-10-14 MED ORDER — GADOBUTROL 1 MMOL/ML IV SOLN
7.0000 mL | Freq: Once | INTRAVENOUS | Status: AC | PRN
  Administered 2023-10-14: 7 mL via INTRAVENOUS

## 2023-10-21 ENCOUNTER — Ambulatory Visit: Admitting: Orthopedic Surgery

## 2023-10-21 DIAGNOSIS — M25552 Pain in left hip: Secondary | ICD-10-CM

## 2023-10-21 MED ORDER — HYDROCODONE-ACETAMINOPHEN 5-325 MG PO TABS: 1.0000 | ORAL_TABLET | Freq: Four times a day (QID) | ORAL | 0 refills | Status: DC | PRN

## 2023-10-21 NOTE — Progress Notes (Signed)
 Orthopedic Office Note  Patient continues to have significant left hip pain.  He is also having pain rating down the left lower extremity along the lateral aspect of the thigh.  Since he was last seen in the office, he obtained imaging of the left hip.  He comes in today to follow-up on that imaging study.  He has not noticed any new or changes in his symptoms.  His MRI from 10/14/2023 of the left hip was independently reviewed and interpreted, showing a heterogeneous mass posterior and superior to the hip joint.  There is significant arthritis seen within the joint as well.  In the CT from the same date, calcifications are seen around the periphery of the mass.  There is no cortical breakthrough seen.  No fracture or dislocation seen.  After going over this imaging with him, I explained that I have concerns about this tumor.  It could be completely benign but there is a possibility that it is malignant as well.  I told them that with soft tissue tumors in the extremities, it is best if you have a biopsy by an orthopedic oncologist in case they do need to do surgery.  It tends to lead to better outcomes.  Since our group does not have any orthopedic oncologist, I referred him to the nearest academic center at [facility] to see orthopedic oncology there.  I will follow him up after he undergoes further workup of this mass.  Some of his symptoms do seem consistent with radiculopathy and could be addressed after this mass is worked up further.  Ronald DELENA Ada, MD Orthopedic Surgery

## 2023-10-25 ENCOUNTER — Telehealth: Payer: Self-pay | Admitting: Orthopedic Surgery

## 2023-10-25 ENCOUNTER — Other Ambulatory Visit: Payer: Self-pay | Admitting: Radiology

## 2023-10-25 MED ORDER — HYDROCODONE-ACETAMINOPHEN 5-325 MG PO TABS: 1.0000 | ORAL_TABLET | Freq: Four times a day (QID) | ORAL | 0 refills | Status: AC | PRN

## 2023-10-28 ENCOUNTER — Telehealth: Payer: Self-pay | Admitting: Orthopedic Surgery

## 2023-10-28 NOTE — Telephone Encounter (Signed)
 Patient called and said the pain now have moved to the right side of his hip and he don't understand. He is concerned and wants you to call him. CB#[phone]

## 2023-10-28 NOTE — Telephone Encounter (Signed)
I called and advised patient of Dr. Tawanna Sat message

## 2023-11-03 ENCOUNTER — Telehealth: Payer: Self-pay | Admitting: Orthopedic Surgery

## 2023-11-03 ENCOUNTER — Other Ambulatory Visit: Payer: Self-pay | Admitting: Orthopedic Surgery

## 2023-11-03 DIAGNOSIS — M25552 Pain in left hip: Secondary | ICD-10-CM

## 2023-11-03 NOTE — Telephone Encounter (Signed)
 Patient would like to know the results from the MRI and CT scan. Would like Dr. Georgina to call him.

## 2023-11-04 NOTE — Addendum Note (Signed)
 Addended by: GEORGINA SHARPER on: 11/04/2023 05:52 PM   Modules accepted: Orders

## 2023-11-04 NOTE — Telephone Encounter (Signed)
 Orthopedic Telephone Note  Called the patient this evening since he had called earlier this week to go over things.  He has been to [facility] after I had referred him there for the mass posterior to his hip that I thought might need biopsy.  I cannot see the note from [facility] but the patient tells me that he was told that the mass is associated with really bad arthritis.  It is not anything cancerous and does not need any kind of biopsy.  They had recommended a hip replacement for him.  His wife is asking if he could get a hip replacement here.  I told him that that is not a procedure that I do but I have 2 partners that do do that.  I will place a referral to get them to see him.   Ronald DELENA Ada, MD Orthopedic Surgeon

## 2023-11-23 ENCOUNTER — Telehealth: Payer: Self-pay | Admitting: Orthopaedic Surgery

## 2023-11-23 NOTE — Telephone Encounter (Signed)
 Patient wife called and wants you to give her a call. Her husband wants to know if he can go ahead and schedule for the hip replacement CB#[phone]

## 2023-11-25 ENCOUNTER — Telehealth: Payer: Self-pay | Admitting: Orthopedic Surgery

## 2023-11-25 MED ORDER — HYDROCODONE-ACETAMINOPHEN 5-325 MG PO TABS: 1.0000 | ORAL_TABLET | Freq: Four times a day (QID) | ORAL | 0 refills | Status: AC | PRN

## 2023-11-25 NOTE — Telephone Encounter (Signed)
 Pt's wife called requesting a refill of hydrocodone . Please send to pharmacy on file.Pt phone number is [phone].

## 2023-12-13 ENCOUNTER — Ambulatory Visit (INDEPENDENT_AMBULATORY_CARE_PROVIDER_SITE_OTHER): Admitting: Orthopaedic Surgery

## 2023-12-13 ENCOUNTER — Other Ambulatory Visit: Payer: Self-pay

## 2023-12-13 DIAGNOSIS — M25552 Pain in left hip: Secondary | ICD-10-CM | POA: Diagnosis not present

## 2023-12-13 DIAGNOSIS — M1612 Unilateral primary osteoarthritis, left hip: Secondary | ICD-10-CM | POA: Insufficient documentation

## 2023-12-13 NOTE — Progress Notes (Signed)
 The patient is an active 73 year old gentleman with debilitating left hip pain and arthritis.  He is actually a patient of one of our partners Dr. Georgina who performed an L4-L5 and L5-S1 laminectomy with foraminotomies in August of last year.  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  This has been confirmed with x-rays, clinical exam and mainly CT and MRI findings of the left hip.  He was actually referred over to [facility] because there was concerned about the soft tissue mass just posterior to the acetabulum.  This was worked up by an orthopedic oncologist who felt this was more of a fluid collection related to his hip arthritis and actually not any type of worrisome finding as a relates to any type of sarcoma.  The patient does report severe daily left hip pain.  He still works through this and has a job as a IT sales professional.  He is sent to me to discuss hip replacement surgery.  His left hip pain is daily and it is 10 out of 10.  His detriment affecting his mobility, his quality of life and his actives daily living.  I was able to review his past medical history and medications within epic.  He is does have to take hydrocodone  as a relates to his pain and tried to discourage the use of any type of narcotics especially for someone who works and does drive I did review his medications and he is a type II diabetic but reports good blood glucose control.  He is not on blood thinning medications.  Examination of his left hip shows significant stiffness with internal and external rotation and I cannot fully rotate his hip.  There is also severe pain with rotation of the left hip.  His right hip moves smoothly and fluidly.  All the imaging studies were reviewed of his left hip and pelvis and it shows severe arthritis of  left hip with osteophytes and cystic changes as well as significant joint space narrowing.  I can see where there was concern about a soft tissue mass but it looks like this is more of chronic findings.  Again the patient has been seen by orthopedic oncologist who feels that this is related to the arthritis in the patient's left hip.  We did talk at length in detail about hip replacement surgery.  I showed him a hip replacement model and gave him a hand about hip replacement surgery.  We discussed the risks and benefits of the surgery and what to expect from an intraoperative and postoperative standpoint.  He is interested in having the surgery performed so we will work on getting on the schedule soon as we can and we will be in touch about scheduling him for a left total hip arthroplasty.

## 2023-12-20 ENCOUNTER — Telehealth: Payer: Self-pay

## 2023-12-20 NOTE — Telephone Encounter (Signed)
   Pre-operative Risk Assessment    Patient Name: Ronald [name].  DOB: 07/12/1950 MRN: [id number]   Date of last office visit: 12/28/22  Dr. Alveta Date of next office visit: 12/22/23  Lum Louis, NP   Request for Surgical Clearance    Procedure:  Left total hip Arthroplasty  Date of Surgery:  Clearance TBD                               Surgeon:  Lonni Poli Surgeon's Group or Practice Name:  [facility] Phone number:  [phone] Fax number:  [phone]   Type of Clearance Requested:   - Medical none indicated   Type of Anesthesia:  Spinal   Additional requests/questions:    Bonney Arlyne LITTIE Kallie   12/20/2023, 5:13 PM

## 2023-12-21 NOTE — Telephone Encounter (Signed)
 Patient is scheduled to see Lum Louis, NP tomorrow in the cardiology office.  Will defer cardiac clearance to [facility].

## 2023-12-22 ENCOUNTER — Ambulatory Visit: Attending: Internal Medicine | Admitting: Emergency Medicine

## 2023-12-22 ENCOUNTER — Encounter: Payer: Self-pay | Admitting: Emergency Medicine

## 2023-12-22 ENCOUNTER — Other Ambulatory Visit: Payer: Self-pay | Admitting: Physician Assistant

## 2023-12-22 VITALS — BP 134/62 | HR 82 | Ht 64.5 in | Wt 153.0 lb

## 2023-12-22 DIAGNOSIS — E782 Mixed hyperlipidemia: Secondary | ICD-10-CM

## 2023-12-22 DIAGNOSIS — Z01818 Encounter for other preprocedural examination: Secondary | ICD-10-CM

## 2023-12-22 DIAGNOSIS — R011 Cardiac murmur, unspecified: Secondary | ICD-10-CM

## 2023-12-22 DIAGNOSIS — I350 Nonrheumatic aortic (valve) stenosis: Secondary | ICD-10-CM | POA: Diagnosis not present

## 2023-12-22 DIAGNOSIS — I10 Essential (primary) hypertension: Secondary | ICD-10-CM | POA: Diagnosis not present

## 2023-12-22 DIAGNOSIS — Z01810 Encounter for preprocedural cardiovascular examination: Secondary | ICD-10-CM

## 2023-12-22 DIAGNOSIS — I5032 Chronic diastolic (congestive) heart failure: Secondary | ICD-10-CM

## 2023-12-22 NOTE — Progress Notes (Unsigned)
 Cardiology Office Note:    Date:  12/23/2023  ID:  Prentiss Jama Angelena Mickey., DOB 11/29/1950, MRN [id number] PCP: Gib Charleston, MD  [facility] Providers Cardiologist:  Aleene Passe, MD (Inactive) Cardiology APP:  Rana Lum CROME, NP       Patient Profile:       Chief Complaint: 1 year follow-up and preoperative clearance History of Present Illness:  Johnjoseph Rolfe. is a 73 y.o. male with visit-pertinent history of aortic stenosis, hypertension  Patient established with cardiology service on 06/22/2022 for evaluation of heart murmur.  Echocardiogram was ordered and completed on 07/15/2022 with LVEF 60 to 65%, no RWMA, grade 1 DD, RV function and size normal, trivial MR, mild aortic valve stenosis with mean gradient of 11.4 mmHg.  He was last seen in clinic on 12/28/2022.  He is doing well without acute complaints.  He was to follow-up in a year.  He is now pending left total hip arthroplasty with [facility]   Discussed the use of AI scribe software for clinical note transcription with the patient, who gave verbal consent to proceed.  History of Present Illness Davinder Haff. is a 73 year old male with mild aortic valve stenosis who presents for preoperative cardiovascular evaluation.  Today he is doing well without acute cardiovascular concerns.    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  Currently pending left hip surgery now finds himself using his right hip more.  He remains active, mowing his two-acre yard with both a push mower and a riding mower.  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  He goes up and down ladders almost daily without exertional symptoms.  He denies any active chest pains or exertional chest pains.  No exertional dyspnea.  He underwent spinal surgery on L4 and L5 in August of the previous year due to nerve compression. He had two epidural injections prior to  surgery; the second relieved his leg pain. He was out of work for six weeks post-surgery.  He takes hydrocodone  for pain, typically half a tablet once a day, sometimes at night, and occasionally in the afternoon. He recently switched from ibuprofen to Tylenol .  No chest pain, shortness of breath, orthopnea, PND, dizziness, syncope, presyncope or leg swelling during physical activities such as mowing or climbing ladders. Occasional lightheadedness occurs when looking up while stationary, but not during exertion.  He quit smoking in 1995 and does not consume alcohol or use recreational drugs.  Review of systems:  Please see the history of present illness. All other systems are reviewed and otherwise negative.      Studies Reviewed:    EKG Interpretation Date/Time:  Wednesday December 22 2023 08:14:42 EDT Ventricular Rate:  88 PR Interval:  192 QRS Duration:  76 QT Interval:  352 QTC Calculation: 425 R Axis:   34  Text Interpretation: Normal sinus rhythm Nonspecific T wave abnormality When compared with ECG of 25-May-2007 15:25, No significant change was found Confirmed by Rana Lum [phone]) on 12/23/2023 12:09:27 PM    Echocardiogram 07/16/2022 1. Left ventricular ejection fraction, by estimation, is 60 to 65%. The  left ventricle has normal function. The left ventricle has no regional  wall motion abnormalities. Left ventricular diastolic parameters are  consistent with Grade I diastolic  dysfunction (impaired relaxation).   2. Right ventricular systolic function is normal. The right ventricular  size is normal.  3. The mitral valve is normal in structure. Trivial mitral valve  regurgitation. No evidence of mitral stenosis.   4. The aortic valve is tricuspid. There is moderate calcification of the  aortic valve. Aortic valve regurgitation is not visualized. Mild aortic  valve stenosis. Aortic valve area, by VTI measures 1.13 cm. Aortic valve  mean gradient measures 11.4  mmHg.  Aortic valve Vmax measures 2.27 m/s.   5. The inferior vena cava is normal in size with greater than 50%  respiratory variability, suggesting right atrial pressure of 3 mmHg.    Risk Assessment/Calculations:              Physical Exam:   VS:  BP 134/62 (BP Location: Left Arm, Patient Position: Sitting, Cuff Size: Normal)   Pulse 82   Ht 5' 4.5 (1.638 m)   Wt 153 lb (69.4 kg)   BMI 25.86 kg/m    Wt Readings from Last 3 Encounters:  12/22/23 153 lb (69.4 kg)  02/05/23 154 lb (69.9 kg)  12/28/22 152 lb 6.4 oz (69.1 kg)    GEN: Well nourished, well developed in no acute distress NECK: No JVD; No carotid bruits CARDIAC: RRR.  2/6 aortic murmur.  No rubs, gallops RESPIRATORY:  Clear to auscultation without rales, wheezing or rhonchi  ABDOMEN: Soft, non-tender, non-distended EXTREMITIES:  No edema; No acute deformity      Assessment and Plan:  Aortic valve stenosis Echocardiogram 07/16/2022 with LVEF 60 to 65% and mild aortic stenosis with mean gradient 11.4 mmHg - Today he remains asymptomatic without chest pains, dyspnea, syncope.  He remains active as a Education administrator going up and down ladders daily as well as frequently push mowing his yard without any exertional symptoms - There are no interventions warranted at this time as he remains asymptomatic - Can repeat echocardiogram around 06/2025 for routine monitoring  Hypertension Blood pressure today well-controlled 134/62 - Continue amlodipine  5 mg daily, hydrochlorothiazide  12.5 mg daily, losartan  100 mg daily, metoprolol  succinate 25 mg daily  Hyperlipidemia LDL 48 on 07/2023 and well-controlled - Continue atorvastatin  40 mg daily  Preoperative cardiovascular clearance According to the Revised Cardiac Risk Index (RCRI), his Perioperative Risk of Major Cardiac Event is (%): 0.4. His Functional Capacity in METs is: 5.62 according to the Duke Activity Status Index (DASI).  Therefore, based on ACC/AHA guidelines, patient  would be at acceptable risk for the planned procedure without further cardiovascular testing. I will route this recommendation to the requesting party via Epic fax function.      Dispo:  Return in about 1 year (around 12/21/2024).  Signed, Lum LITTIE Louis, NP

## 2023-12-22 NOTE — Patient Instructions (Signed)
 Medication Instructions:  NO CHANGES  Lab Work: NONE TO BE DONE TODAY.  Testing/Procedures: NONE  Follow-Up: At [facility], you and your health needs are our priority.  As part of our continuing mission to provide you with exceptional heart care, our providers are all part of one team.  This team includes your primary Cardiologist (physician) and Advanced Practice Providers or APPs (Physician Assistants and Nurse Practitioners) who all work together to provide you with the care you need, when you need it.  Your next appointment:   1 YEAR  Provider:   MADISON FOUNTAIN, NP

## 2023-12-23 ENCOUNTER — Telehealth: Payer: Self-pay | Admitting: Orthopaedic Surgery

## 2023-12-23 ENCOUNTER — Encounter: Payer: Self-pay | Admitting: Emergency Medicine

## 2023-12-23 NOTE — Telephone Encounter (Signed)
 Patient would like a note for his job stating when the surgery will be, what type of surgery and the estimate time of being out of work. He will come by and pick up the letter. His wife called. Her cb# [phone]

## 2023-12-23 NOTE — Progress Notes (Signed)
 Surgical Instructions   Your procedure is scheduled on Tuesday, December 28, 2023. Report to [facility] Main Entrance A at 8:00 A.M., then check in with the Admitting office. Any questions or running late day of surgery: call [phone]  Questions prior to your surgery date: call [phone], Monday-Friday, 8am-4pm. If you experience any cold or flu symptoms such as cough, fever, chills, shortness of breath, etc. between now and your scheduled surgery, please notify us  at the above number.     Remember:  Do not eat after midnight the night before your surgery  You may drink clear liquids until 7:00 the morning of your surgery.   Clear liquids allowed are: Water, Non-Citrus Juices (without pulp), Carbonated Beverages, Clear Tea (no milk, honey, etc.), Black Coffee Only (NO MILK, CREAM OR POWDERED CREAMER of any kind), and Gatorade.  Patient Instructions  The night before surgery:  No food after midnight. ONLY clear liquids after midnight  The day of surgery (if you have diabetes): Drink ONE (1) 12 oz G2 given to you in your pre admission testing appointment by 7:00 the morning of surgery. Drink in one sitting. Do not sip.  This drink was given to you during your hospital  pre-op appointment visit.  Nothing else to drink after completing the  12 oz bottle of G2.         If you have questions, please contact your surgeon's office.    Take these medicines the morning of surgery with A SIP OF WATER  amLODipine  (NORVASC )  metoprolol  succinate (TOPROL -XL)  pregabalin  (LYRICA )    May take these medicines IF NEEDED: acetaminophen  (TYLENOL )  HYDROcodone -acetaminophen  (NORCO/VICODIN)  oxymetazoline (AFRIN) 0.05 % nasal spray    Follow your surgeon's instructions on when to stop Asprin.  If no instructions were given by your surgeon then you will need to call the office to get those instructions.     One week prior to surgery, STOP taking any Aleve, Naproxen, Ibuprofen, Motrin,  Advil, Goody's, BC's, all herbal medications, fish oil, and non-prescription vitamins. This includes your meloxicam  (MOBIC ).     WHAT DO I DO ABOUT MY DIABETES MEDICATION?   Do not take oral diabetes medicines (pills) the morning of surgery.        DO NOT TAKE YOUR glipiZIDE  (GLUCOTROL  XL) THE MORNING OF SURGERY.        DO NOT TAKE YOUR JARDIANCE  72 HOURS PRIOR TO SURGERY, WITH THE LAST DOSE BEING 12/24/2023.         DO NOT TAKE YOUR metFORMIN  (GLUCOPHAGE -XR) THE MORNING OF SURGERY.         DO NOT TAKE YOUR pioglitazone (ACTOS) THE MORNING OF SURGERY.          DO NOT TAKE YOUR OZEMPIC 7 DAYS PRIOR TO SURGERY WITH THE LAST DOSE BEING NO LATER THAN 12/20/2023.         The day of surgery, do not take other diabetes injectables, including Byetta (exenatide), Bydureon (exenatide ER), Victoza (liraglutide), or Trulicity (dulaglutide).  If your CBG is greater than 220 mg/dL, you may take  of your sliding scale (correction) dose of insulin .   HOW TO MANAGE YOUR DIABETES BEFORE AND AFTER SURGERY  Why is it important to control my blood sugar before and after surgery? Improving blood sugar levels before and after surgery helps healing and can limit problems. A way of improving blood sugar control is eating a healthy diet by:  Eating less sugar and carbohydrates  Increasing activity/exercise  Talking with your  doctor about reaching your blood sugar goals High blood sugars (greater than 180 mg/dL) can raise your risk of infections and slow your recovery, so you will need to focus on controlling your diabetes during the weeks before surgery. Make sure that the doctor who takes care of your diabetes knows about your planned surgery including the date and location.  How do I manage my blood sugar before surgery? Check your blood sugar at least 4 times a day, starting 2 days before surgery, to make sure that the level is not too high or low.  Check your blood sugar the morning of your surgery  when you wake up and every 2 hours until you get to the Short Stay unit.  If your blood sugar is less than 70 mg/dL, you will need to treat for low blood sugar: Do not take insulin . Treat a low blood sugar (less than 70 mg/dL) with  cup of clear juice (cranberry or apple), 4 glucose tablets, OR glucose gel. Recheck blood sugar in 15 minutes after treatment (to make sure it is greater than 70 mg/dL). If your blood sugar is not greater than 70 mg/dL on recheck, call [phone] for further instructions. Report your blood sugar to the short stay nurse when you get to Short Stay.  If you are admitted to the hospital after surgery: Your blood sugar will be checked by the staff and you will probably be given insulin  after surgery (instead of oral diabetes medicines) to make sure you have good blood sugar levels. The goal for blood sugar control after surgery is 80-180 mg/dL.                      Do NOT Smoke (Tobacco/Vaping) for 24 hours prior to your procedure.  If you use a CPAP at night, you may bring your mask/headgear for your overnight stay.   You will be asked to remove any contacts, glasses, piercing's, hearing aid's, dentures/partials prior to surgery. Please bring cases for these items if needed.    Patients discharged the day of surgery will not be allowed to drive home, and someone needs to stay with them for 24 hours.  SURGICAL WAITING ROOM VISITATION Patients may have no more than 2 support people in the waiting area - these visitors may rotate.   Pre-op nurse will coordinate an appropriate time for 1 ADULT support person, who may not rotate, to accompany patient in pre-op.  Children under the age of 35 must have an adult with them who is not the patient and must remain in the main waiting area with an adult.  If the patient needs to stay at the hospital during part of their recovery, the visitor guidelines for inpatient rooms apply.  Please refer to the [facility] website for  the visitor guidelines for any additional information.   If you received a COVID test during your pre-op visit  it is requested that you wear a mask when out in public, stay away from anyone that may not be feeling well and notify your surgeon if you develop symptoms. If you have been in contact with anyone that has tested positive in the last 10 days please notify you surgeon.      Pre-operative 5 CHG Bathing Instructions   You can play a key role in reducing the risk of infection after surgery. Your skin needs to be as free of germs as possible. You can reduce the number of germs on your skin by  washing with CHG (chlorhexidine  gluconate) soap before surgery. CHG is an antiseptic soap that kills germs and continues to kill germs even after washing.   DO NOT use if you have an allergy to chlorhexidine /CHG or antibacterial soaps. If your skin becomes reddened or irritated, stop using the CHG and notify one of our RNs at [phone].   Please shower with the CHG soap starting 4 days before surgery using the following schedule:     Please keep in mind the following:  You may shave your face at any point before/day of surgery.  Place clean sheets on your bed the day you start using CHG soap. Use a clean washcloth (not used since being washed) for each shower. DO NOT sleep with pets once you start using the CHG.   CHG Shower Instructions:  Wash your face and private area with normal soap. If you choose to wash your hair, wash first with your normal shampoo.  After you use shampoo/soap, rinse your hair and body thoroughly to remove shampoo/soap residue.  Turn the water OFF and apply about 3 tablespoons (45 ml) of CHG soap to a CLEAN washcloth.  Apply CHG soap ONLY FROM YOUR NECK DOWN TO YOUR TOES (washing for 3-5 minutes)  DO NOT use CHG soap on face, private areas, open wounds, or sores.  Pay special attention to the area where your surgery is being performed.  If you are having back  surgery, having someone wash your back for you may be helpful. Wait 2 minutes after CHG soap is applied, then you may rinse off the CHG soap.  Pat dry with a clean towel  Put on clean clothes/pajamas   If you choose to wear lotion, please use ONLY the CHG-compatible lotions that are listed below.  Additional instructions for the day of surgery: DO NOT APPLY any lotions, deodorants or cologne.   Do not bring valuables to the hospital. [facility] is not responsible for any belongings/valuables. Do not wear jewelry Put on clean/comfortable clothes.  Please brush your teeth.  Ask your nurse before applying any prescription medications to the skin.     CHG Compatible Lotions   Aveeno Moisturizing lotion  Cetaphil Moisturizing Cream  Cetaphil Moisturizing Lotion  Clairol Herbal Essence Moisturizing Lotion, Dry Skin  Clairol Herbal Essence Moisturizing Lotion, Extra Dry Skin  Clairol Herbal Essence Moisturizing Lotion, Normal Skin  Curel Age Defying Therapeutic Moisturizing Lotion with Alpha Hydroxy  Curel Extreme Care Body Lotion  Curel Soothing Hands Moisturizing Hand Lotion  Curel Therapeutic Moisturizing Cream, Fragrance-Free  Curel Therapeutic Moisturizing Lotion, Fragrance-Free  Curel Therapeutic Moisturizing Lotion, Original Formula  Eucerin Daily Replenishing Lotion  Eucerin Dry Skin Therapy Plus Alpha Hydroxy Crme  Eucerin Dry Skin Therapy Plus Alpha Hydroxy Lotion  Eucerin Original Crme  Eucerin Original Lotion  Eucerin Plus Crme Eucerin Plus Lotion  Eucerin TriLipid Replenishing Lotion  Keri Anti-Bacterial Hand Lotion  Keri Deep Conditioning Original Lotion Dry Skin Formula Softly Scented  Keri Deep Conditioning Original Lotion, Fragrance Free Sensitive Skin Formula  Keri Lotion Fast Absorbing Fragrance Free Sensitive Skin Formula  Keri Lotion Fast Absorbing Softly Scented Dry Skin Formula  Keri Original Lotion  Keri Skin Renewal Lotion Keri Silky Smooth Lotion   Keri Silky Smooth Sensitive Skin Lotion  Nivea Body Creamy Conditioning Oil  Nivea Body Extra Enriched Teacher, adult education Moisturizing Lotion Nivea Crme  Nivea Skin Firming Lotion  NutraDerm 30 Skin Lotion  NutraDerm Skin Lotion  NutraDerm  Therapeutic Skin Cream  NutraDerm Therapeutic Skin Lotion  ProShield Protective Hand Cream  Provon moisturizing lotion  Please read over the following fact sheets that you were given.

## 2023-12-23 NOTE — Telephone Encounter (Signed)
Patient aware note at front desk  

## 2023-12-24 ENCOUNTER — Other Ambulatory Visit: Payer: Self-pay

## 2023-12-24 ENCOUNTER — Encounter (HOSPITAL_COMMUNITY): Payer: Self-pay

## 2023-12-24 ENCOUNTER — Encounter (HOSPITAL_COMMUNITY)
Admission: RE | Admit: 2023-12-24 | Discharge: 2023-12-24 | Disposition: A | Discharge status: Home / Self Care | Source: Ambulatory Visit | Attending: Orthopaedic Surgery | Admitting: Orthopaedic Surgery

## 2023-12-24 VITALS — BP 133/64 | HR 78 | Temp 98.7°F | Resp 18 | Ht 64.5 in | Wt 153.0 lb

## 2023-12-24 DIAGNOSIS — E1122 Type 2 diabetes mellitus with diabetic chronic kidney disease: Secondary | ICD-10-CM | POA: Diagnosis not present

## 2023-12-24 DIAGNOSIS — Z01812 Encounter for preprocedural laboratory examination: Secondary | ICD-10-CM | POA: Insufficient documentation

## 2023-12-24 DIAGNOSIS — E785 Hyperlipidemia, unspecified: Secondary | ICD-10-CM | POA: Insufficient documentation

## 2023-12-24 DIAGNOSIS — Z87891 Personal history of nicotine dependence: Secondary | ICD-10-CM | POA: Insufficient documentation

## 2023-12-24 DIAGNOSIS — E1142 Type 2 diabetes mellitus with diabetic polyneuropathy: Secondary | ICD-10-CM | POA: Insufficient documentation

## 2023-12-24 DIAGNOSIS — K219 Gastro-esophageal reflux disease without esophagitis: Secondary | ICD-10-CM | POA: Insufficient documentation

## 2023-12-24 DIAGNOSIS — E119 Type 2 diabetes mellitus without complications: Secondary | ICD-10-CM

## 2023-12-24 DIAGNOSIS — N183 Chronic kidney disease, stage 3 unspecified: Secondary | ICD-10-CM | POA: Diagnosis not present

## 2023-12-24 DIAGNOSIS — I129 Hypertensive chronic kidney disease with stage 1 through stage 4 chronic kidney disease, or unspecified chronic kidney disease: Secondary | ICD-10-CM | POA: Diagnosis not present

## 2023-12-24 DIAGNOSIS — Z01818 Encounter for other preprocedural examination: Secondary | ICD-10-CM

## 2023-12-24 DIAGNOSIS — I350 Nonrheumatic aortic (valve) stenosis: Secondary | ICD-10-CM | POA: Diagnosis not present

## 2023-12-24 HISTORY — DX: Unspecified osteoarthritis, unspecified site: M19.90

## 2023-12-24 LAB — HEMOGLOBIN A1C
Hgb A1c MFr Bld: 7 % — ABNORMAL HIGH (ref 4.8–5.6)
Mean Plasma Glucose: 154.2 mg/dL

## 2023-12-24 LAB — SURGICAL PCR SCREEN
MRSA, PCR: NEGATIVE
Staphylococcus aureus: NEGATIVE

## 2023-12-24 LAB — TYPE AND SCREEN
ABO/RH(D): A POS
Antibody Screen: NEGATIVE

## 2023-12-24 LAB — CBC
HCT: 49.3 % (ref 39.0–52.0)
Hemoglobin: 16.2 g/dL (ref 13.0–17.0)
MCH: 29.7 pg (ref 26.0–34.0)
MCHC: 32.9 g/dL (ref 30.0–36.0)
MCV: 90.3 fL (ref 80.0–100.0)
Platelets: 215 K/uL (ref 150–400)
RBC: 5.46 MIL/uL (ref 4.22–5.81)
RDW: 13.3 % (ref 11.5–15.5)
WBC: 7.3 K/uL (ref 4.0–10.5)
nRBC: 0 % (ref 0.0–0.2)

## 2023-12-24 LAB — COMPREHENSIVE METABOLIC PANEL WITH GFR
ALT: 20 U/L (ref 0–44)
AST: 16 U/L (ref 15–41)
Albumin: 3.9 g/dL (ref 3.5–5.0)
Alkaline Phosphatase: 48 U/L (ref 38–126)
Anion gap: 12 (ref 5–15)
BUN: 21 mg/dL (ref 8–23)
CO2: 25 mmol/L (ref 22–32)
Calcium: 9.3 mg/dL (ref 8.9–10.3)
Chloride: 101 mmol/L (ref 98–111)
Creatinine, Ser: 1.06 mg/dL (ref 0.61–1.24)
GFR, Estimated: >60 mL/min (ref >60)
Glucose, Bld: 109 mg/dL — ABNORMAL HIGH (ref 70–99)
Potassium: 3.9 mmol/L (ref 3.5–5.1)
Sodium: 138 mmol/L (ref 135–145)
Total Bilirubin: 1.4 mg/dL — ABNORMAL HIGH (ref 0.0–1.2)
Total Protein: 6.6 g/dL (ref 6.5–8.1)

## 2023-12-24 LAB — GLUCOSE, CAPILLARY: Glucose-Capillary: 136 mg/dL — ABNORMAL HIGH (ref 70–99)

## 2023-12-24 NOTE — Progress Notes (Signed)
 PCP - Dr. Vernell Fort (formally Lamar Quarry, but since retired. New PCP at same practice) Cardiologist - Dr. Aleene Passe - last office visit 12/22/2023  PPM/ICD - Denies Device Orders - n/a Rep Notified - n/a  Chest x-ray - n/a EKG - 12/22/2023 Stress Test - denies ECHO - 07/16/2022 Cardiac Cath - Denies  Sleep Study - Denies CPAP - n/a  Pt is DM2. He had a glucose meter at home, but does not check his glucose. When he was checking it, normal fasting was 90-110 (which was about a year ago). CBG at pre-op appointment 136. A1c result pending  Last dose of GLP1 agonist-  Last dose of Ozempic 9/26 GLP1 instructions: Pt instructed to hold mediation until after surgery  Blood Thinner Instructions: n/a Aspirin Instructions: Pt states he received instructions, but can not remember how long. RN instructed pt to look for them when he gets home today and if cannot find them, he needs to call surgeon's office to receive them.  ERAS Protcol - Clear liquids until 0700 morning of surgery PRE-SURGERY Ensure or G2- G2 given to pt with instructions  COVID TEST- n/a   Anesthesia review: Yes. Cardiac clearance, HTN, heart murmur, DM2 and CKD  Patient denies shortness of breath, fever, cough and chest pain at PAT appointment. Pt denies any respiratory illness/infection in the last two months.   All instructions explained to the patient, with a verbal understanding of the material. Patient agrees to go over the instructions while at home for a better understanding. Patient also instructed to self quarantine after being tested for COVID-19. The opportunity to ask questions was provided.

## 2023-12-27 ENCOUNTER — Ambulatory Visit: Admitting: Internal Medicine

## 2023-12-27 NOTE — Anesthesia Preprocedure Evaluation (Signed)
 Anesthesia Evaluation  Patient identified by MRN, date of birth, ID band Patient awake    Reviewed: Allergy & Precautions, NPO status , Patient's Chart, lab work & pertinent test results  Airway Mallampati: I  TM Distance: >3 FB Neck ROM: Full    Dental  (+) Edentulous Upper, Missing   Pulmonary former smoker    + decreased breath sounds      Cardiovascular hypertension, Pt. on medications and Pt. on home beta blockers  Rhythm:Regular Rate:Normal + Systolic murmurs Echo:  1. Left ventricular ejection fraction, by estimation, is 60 to 65%. The  left ventricle has normal function. The left ventricle has no regional  wall motion abnormalities. Left ventricular diastolic parameters are  consistent with Grade I diastolic  dysfunction (impaired relaxation).   2. Right ventricular systolic function is normal. The right ventricular  size is normal.   3. The mitral valve is normal in structure. Trivial mitral valve  regurgitation. No evidence of mitral stenosis.   4. The aortic valve is tricuspid. There is moderate calcification of the  aortic valve. Aortic valve regurgitation is not visualized. Mild aortic  valve stenosis. Aortic valve area, by VTI measures 1.13 cm. Aortic valve  mean gradient measures 11.4 mmHg.  Aortic valve Vmax measures 2.27 m/s.   5. The inferior vena cava is normal in size with greater than 50%  respiratory variability, suggesting right atrial pressure of 3 mmHg.     Neuro/Psych    GI/Hepatic Neg liver ROS,GERD  ,,  Endo/Other  diabetes, Type 2, Oral Hypoglycemic Agents, Insulin  Dependent    Renal/GU Renal disease     Musculoskeletal   Abdominal   Peds  Hematology   Anesthesia Other Findings   Reproductive/Obstetrics                              Anesthesia Physical Anesthesia Plan  ASA: 2  Anesthesia Plan: Spinal   Post-op Pain Management: Ofirmev  IV (intra-op)*    Induction: Intravenous  PONV Risk Score and Plan: 2 and Ondansetron  and Propofol  infusion  Airway Management Planned: Natural Airway and Nasal Cannula  Additional Equipment: None  Intra-op Plan:   Post-operative Plan:   Informed Consent: I have reviewed the patients History and Physical, chart, labs and discussed the procedure including the risks, benefits and alternatives for the proposed anesthesia with the patient or authorized representative who has indicated his/her understanding and acceptance.       Plan Discussed with: CRNA  Anesthesia Plan Comments: (Lab Results      Component                Value               Date                      WBC                      7.3                 12/24/2023                HGB                      16.2                12/24/2023  HCT                      49.3                12/24/2023                MCV                      90.3                12/24/2023                PLT                      215                 12/24/2023          \  PAT note by Lynwood Hope, PA-C: 73 year old male follows with cardiology for history of HLD, HTN, mild aortic stenosis (mean gradient 11.4 mmHg by echo 06/2022).  Seen by Lum Louis, NP 12/22/2023 for preop evaluation.  Per note, According to the Revised Cardiac Risk Index (RCRI), his Perioperative Risk of Major Cardiac Event is (%): 0.4. His Functional Capacity in METs is: 5.62 according to the [employer] Activity Status Index (DASI). Therefore, based on ACC/AHA guidelines, patient would be at acceptable risk for the planned procedure without further cardiovascular testing. I will route this recommendation to the requesting party via Epic fax function.  Other pertinent history includes former smoker (36 pack years, quit 1995), non-insulin -dependent DM2 with peripheral neuropathy, GERD, CKD 3.  Patient reports last dose Ozempic 12/17/2023.  Preop labs reviewed, unremarkable.  DM2  well-controlled A1c 7.0.  EKG 12/22/2023: Normal sinus rhythm.  Rate 88. Nonspecific T wave abnormality.  TTE 07/16/2022: 1. Left ventricular ejection fraction, by estimation, is 60 to 65%. The  left ventricle has normal function. The left ventricle has no regional  wall motion abnormalities. Left ventricular diastolic parameters are  consistent with Grade I diastolic  dysfunction (impaired relaxation).  2. Right ventricular systolic function is normal. The right ventricular  size is normal.  3. The mitral valve is normal in structure. Trivial mitral valve  regurgitation. No evidence of mitral stenosis.  4. The aortic valve is tricuspid. There is moderate calcification of the  aortic valve. Aortic valve regurgitation is not visualized. Mild aortic  valve stenosis. Aortic valve area, by VTI measures 1.13 cm. Aortic valve  mean gradient measures 11.4 mmHg.  Aortic valve Vmax measures 2.27 m/s.  5. The inferior vena cava is normal in size with greater than 50%  respiratory variability, suggesting right atrial pressure of 3 mmHg.     )         Anesthesia Quick Evaluation

## 2023-12-27 NOTE — H&P (Signed)
 TOTAL HIP ADMISSION H&P  Patient is admitted for left total hip arthroplasty.  Subjective:  Chief Complaint: left hip pain  HPI: Ronald Jama Angelena Mickey., 73 y.o. male, has a history of pain and functional disability in the left hip(s) due to arthritis and patient has failed non-surgical conservative treatments for greater than 12 weeks to include NSAID's and/or analgesics, flexibility and strengthening excercises, use of assistive devices, and activity modification.  Onset of symptoms was gradual starting a few years ago with gradually worsening course since that time.The patient noted no past surgery on the left hip(s).  Patient currently rates pain in the left hip at 10 out of 10 with activity. Patient has night pain, worsening of pain with activity and weight bearing, trendelenberg gait, pain that interfers with activities of daily living, and pain with passive range of motion. Patient has evidence of subchondral sclerosis, periarticular osteophytes, and joint space narrowing by imaging studies. This condition presents safety issues increasing the risk of falls.  There is no current active infection.  Patient Active Problem List   Diagnosis Date Noted   Unilateral primary osteoarthritis, left hip 12/13/2023   Biceps tendon rupture, proximal, right, initial encounter 02/05/2023   Aortic stenosis 12/28/2022   Lumbar radiculopathy 11/02/2022   Protrusion of lumbar intervertebral disc 05/22/2022   Hip strain, left, initial encounter 10/06/2019   Trigger finger, left middle finger 03/28/2019   Contusion of finger of right hand 03/28/2019   Other intervertebral disc degeneration, lumbar region 03/28/2019   Peroneal tendinitis, right leg 02/19/2019   Trigger thumb, left thumb 06/10/2017   Chronic left shoulder pain 10/08/2016   Neck pain 10/08/2016   Past Medical History:  Diagnosis Date   Arthritis    Left Hip   Carpal tunnel syndrome    Diabetes mellitus without complication (HCC)     Diabetic peripheral neuropathy (HCC)    Diabetic renal disease (HCC)    Elevated hemoglobin    Esophageal polyp    GERD (gastroesophageal reflux disease)    Heart murmur    mild AS 07/16/22   HLD (hyperlipidemia)    Hyperglycemia due to type 2 diabetes mellitus (HCC)    Hyperplasia of prostate    Hypertension    Hypertensive retinopathy    Hypogonadism in male    Kidney disease, chronic, stage III (GFR 30-59 ml/min) (HCC)    Rotator cuff tear    Skin cancer    removed from left arm   Thrombocytosis    Trigger finger     Past Surgical History:  Procedure Laterality Date   CARPAL BOSS EXCISION     CARPAL TUNNEL RELEASE Bilateral    CATARACT EXTRACTION W/ INTRAOCULAR LENS IMPLANT Bilateral    COLONOSCOPY  2022   DECOMPRESSIVE LUMBAR LAMINECTOMY LEVEL 1 N/A 11/02/2022   Procedure: L4-5, L5-S1 LAMINECTOMY AND FORAMINOTOMIES;  Surgeon: Georgina Ozell LABOR, MD;  Location: [facility] OR;  Service: Orthopedics;  Laterality: N/A;   KNEE SURGERY Bilateral    arthroscopic   PARATHYROIDECTOMY Right    right lower removed   ROTATOR CUFF REPAIR Right    ROTATOR CUFF REPAIR Left    and biceps repair    No current facility-administered medications for this encounter.   Current Outpatient Medications  Medication Sig Dispense Refill Last Dose/Taking   acetaminophen  (TYLENOL ) 650 MG CR tablet Take 650-1,300 mg by mouth every 8 (eight) hours as needed for pain.   Taking As Needed   amLODipine  (NORVASC ) 5 MG tablet Take 5 mg by mouth  daily.   Taking   Apoaequorin (PREVAGEN PO) Take 1 capsule by mouth daily.   Taking   Apple Cider Vinegar 500 MG TABS Take 500 mg by mouth 3 (three) times daily with meals.   Taking   ascorbic acid (VITAMIN C) 500 MG tablet Take 500 mg by mouth daily.   Taking   aspirin EC 81 MG tablet Take 81 mg by mouth daily.   Taking   atorvastatin  (LIPITOR) 40 MG tablet Take 40 mg by mouth every evening.   Taking   cholecalciferol (VITAMIN D3) 25 MCG (1000 UNIT) tablet Take 1,000 Units  by mouth daily.   Taking   Chromium-Cinnamon 100-500 MCG-MG CAPS Take 1 capsule by mouth 2 (two) times daily.   Taking   glipiZIDE  (GLUCOTROL  XL) 10 MG 24 hr tablet Take 10 mg by mouth daily.   Taking   Homeopathic Products (THERAWORX MUSCLE CRAMPS SPRAY EX) Apply 1 Application topically 2 (two) times daily as needed (leg cramps).   Taking As Needed   hydrochlorothiazide  (HYDRODIURIL ) 12.5 MG tablet Take 12.5 mg by mouth every morning.   Taking   JARDIANCE  25 MG TABS tablet Take 25 mg by mouth every morning.   Taking   losartan  (COZAAR ) 100 MG tablet Take 100 mg by mouth every morning.   Taking   Magnesium 200 MG TABS Take 400 mg by mouth daily.   Taking   meloxicam  (MOBIC ) 7.5 MG tablet TAKE 1 TABLET BY MOUTH EVERY EVENING *REFILL REQUEST* 30 tablet 10 Taking   metFORMIN  (GLUCOPHAGE -XR) 500 MG 24 hr tablet Take 1,000 mg by mouth 2 (two) times daily with a meal.   Taking   metoprolol  succinate (TOPROL -XL) 25 MG 24 hr tablet Take 1 tablet by mouth once daily 90 tablet 1 Taking   Multiple Vitamin (MULTIVITAMIN WITH MINERALS) TABS tablet Take 1 tablet by mouth daily.   Taking   oxymetazoline (AFRIN) 0.05 % nasal spray Place 1 spray into both nostrils 2 (two) times daily as needed for congestion.   Taking As Needed   OZEMPIC, 2 MG/DOSE, 8 MG/3ML SOPN Inject 2 mg into the skin every Friday.   Taking   pioglitazone (ACTOS) 15 MG tablet Take 15 mg by mouth daily.   Taking   pregabalin  (LYRICA ) 25 MG capsule TAKE 1 CAPSULE BY MOUTH 2 TIMES DAILY. 90 capsule 0 Taking   pregabalin  (LYRICA ) 75 MG capsule TAKE 1 CAPSULE BY MOUTH TWICE A DAY 60 capsule 1 Taking   tamsulosin  (FLOMAX ) 0.4 MG CAPS capsule Take 0.4 mg by mouth daily after supper.   Taking   Testosterone 1.62 % GEL Apply 2 Pump topically daily.   Taking   triamcinolone  ointment (KENALOG ) 0.5 % Apply 1 Application topically daily as needed (eczema).   Taking As Needed   TURMERIC PO Take 1 capsule by mouth in the morning and at bedtime.   Taking    HYDROcodone -acetaminophen  (NORCO/VICODIN) 5-325 MG tablet Take 1 tablet by mouth every 4 (four) hours as needed for moderate pain (pain score 4-6).      Allergies  Allergen Reactions   Aspirin Other (See Comments)    Pt unsure- takes low dose aspirin   Penicillins Hives    As a child    Social History   Tobacco Use   Smoking status: Former    Current packs/day: 0.00    Average packs/day: 2.0 packs/day for 18.0 years (36.0 ttl pk-yrs)    Types: Cigarettes    Start date: 36  Quit date: 28    Years since quitting: 30.7   Smokeless tobacco: Never  Substance Use Topics   Alcohol use: No    No family history on file.   Review of Systems  Objective:  Physical Exam Vitals reviewed.  Constitutional:      Appearance: Normal appearance. He is normal weight.  HENT:     Head: Normocephalic and atraumatic.  Eyes:     Extraocular Movements: Extraocular movements intact.     Pupils: Pupils are equal, round, and reactive to light.  Cardiovascular:     Rate and Rhythm: Normal rate and regular rhythm.  Pulmonary:     Effort: Pulmonary effort is normal.     Breath sounds: Normal breath sounds.  Abdominal:     Palpations: Abdomen is soft.  Musculoskeletal:     Cervical back: Normal range of motion and neck supple.     Left hip: Tenderness and bony tenderness present. Decreased range of motion. Decreased strength.  Neurological:     Mental Status: He is alert and oriented to person, place, and time.  Psychiatric:        Behavior: Behavior normal.     Vital signs in last 24 hours:    Labs:   Estimated body mass index is 25.86 kg/m as calculated from the following:   Height as of 12/24/23: 5' 4.5 (1.638 m).   Weight as of 12/24/23: 69.4 kg.   Imaging Review Plain radiographs demonstrate severe degenerative joint disease of the left hip(s). The bone quality appears to be good for age and reported activity level.      Assessment/Plan:  End stage arthritis, left  hip(s)  The patient history, physical examination, clinical judgement of the provider and imaging studies are consistent with end stage degenerative joint disease of the left hip(s) and total hip arthroplasty is deemed medically necessary. The treatment options including medical management, injection therapy, arthroscopy and arthroplasty were discussed at length. The risks and benefits of total hip arthroplasty were presented and reviewed. The risks due to aseptic loosening, infection, stiffness, dislocation/subluxation,  thromboembolic complications and other imponderables were discussed.  The patient acknowledged the explanation, agreed to proceed with the plan and consent was signed. Patient is being admitted for inpatient treatment for surgery, pain control, PT, OT, prophylactic antibiotics, VTE prophylaxis, progressive ambulation and ADL's and discharge planning.The patient is planning to be discharged home with home health services

## 2023-12-27 NOTE — Progress Notes (Signed)
 Anesthesia Chart Review:  73 year old male follows with cardiology for history of HLD, HTN, mild aortic stenosis (mean gradient 11.4 mmHg by echo 06/2022).  Seen by Lum Louis, NP 12/22/2023 for preop evaluation.  Per note, According to the Revised Cardiac Risk Index (RCRI), his Perioperative Risk of Major Cardiac Event is (%): 0.4. His Functional Capacity in METs is: 5.62 according to the [facility] Activity Status Index (DASI). Therefore, based on ACC/AHA guidelines, patient would be at acceptable risk for the planned procedure without further cardiovascular testing. I will route this recommendation to the requesting party via Epic fax function.  Other pertinent history includes former smoker (36 pack years, quit 1995), non-insulin -dependent DM2 with peripheral neuropathy, GERD, CKD 3.  Patient reports last dose Ozempic 12/17/2023.  Preop labs reviewed, unremarkable.  DM2 well-controlled A1c 7.0.  EKG 12/22/2023: Normal sinus rhythm.  Rate 88. Nonspecific T wave abnormality.  TTE 07/16/2022:  1. Left ventricular ejection fraction, by estimation, is 60 to 65%. The  left ventricle has normal function. The left ventricle has no regional  wall motion abnormalities. Left ventricular diastolic parameters are  consistent with Grade I diastolic  dysfunction (impaired relaxation).   2. Right ventricular systolic function is normal. The right ventricular  size is normal.   3. The mitral valve is normal in structure. Trivial mitral valve  regurgitation. No evidence of mitral stenosis.   4. The aortic valve is tricuspid. There is moderate calcification of the  aortic valve. Aortic valve regurgitation is not visualized. Mild aortic  valve stenosis. Aortic valve area, by VTI measures 1.13 cm. Aortic valve  mean gradient measures 11.4 mmHg.  Aortic valve Vmax measures 2.27 m/s.   5. The inferior vena cava is normal in size with greater than 50%  respiratory variability, suggesting right atrial pressure  of 3 mmHg.     Lynwood Geofm RIGGERS [facility] Short Stay Center/Anesthesiology Phone [phone] 12/27/2023 9:02 AM

## 2023-12-28 ENCOUNTER — Ambulatory Visit (HOSPITAL_COMMUNITY): Payer: Self-pay | Admitting: Physician Assistant

## 2023-12-28 ENCOUNTER — Observation Stay (HOSPITAL_COMMUNITY)
Admission: RE | Admit: 2023-12-28 | Discharge: 2023-12-29 | Disposition: A | Discharge status: Home / Self Care | Attending: Orthopaedic Surgery | Admitting: Orthopaedic Surgery

## 2023-12-28 ENCOUNTER — Observation Stay (HOSPITAL_COMMUNITY)

## 2023-12-28 ENCOUNTER — Ambulatory Visit (HOSPITAL_BASED_OUTPATIENT_CLINIC_OR_DEPARTMENT_OTHER): Admitting: Registered Nurse

## 2023-12-28 ENCOUNTER — Other Ambulatory Visit: Payer: Self-pay

## 2023-12-28 ENCOUNTER — Encounter (HOSPITAL_COMMUNITY): Payer: Self-pay | Admitting: Orthopaedic Surgery

## 2023-12-28 ENCOUNTER — Encounter (HOSPITAL_COMMUNITY): Admission: RE | Disposition: A | Payer: Self-pay | Source: Home / Self Care | Attending: Orthopaedic Surgery

## 2023-12-28 ENCOUNTER — Ambulatory Visit (HOSPITAL_COMMUNITY)

## 2023-12-28 DIAGNOSIS — I129 Hypertensive chronic kidney disease with stage 1 through stage 4 chronic kidney disease, or unspecified chronic kidney disease: Secondary | ICD-10-CM | POA: Diagnosis not present

## 2023-12-28 DIAGNOSIS — M1612 Unilateral primary osteoarthritis, left hip: Secondary | ICD-10-CM | POA: Diagnosis not present

## 2023-12-28 DIAGNOSIS — E1165 Type 2 diabetes mellitus with hyperglycemia: Secondary | ICD-10-CM | POA: Insufficient documentation

## 2023-12-28 DIAGNOSIS — Z7984 Long term (current) use of oral hypoglycemic drugs: Secondary | ICD-10-CM | POA: Diagnosis not present

## 2023-12-28 DIAGNOSIS — Z7982 Long term (current) use of aspirin: Secondary | ICD-10-CM | POA: Diagnosis not present

## 2023-12-28 DIAGNOSIS — E119 Type 2 diabetes mellitus without complications: Secondary | ICD-10-CM

## 2023-12-28 DIAGNOSIS — Z87891 Personal history of nicotine dependence: Secondary | ICD-10-CM | POA: Insufficient documentation

## 2023-12-28 DIAGNOSIS — Z79899 Other long term (current) drug therapy: Secondary | ICD-10-CM | POA: Insufficient documentation

## 2023-12-28 DIAGNOSIS — E1142 Type 2 diabetes mellitus with diabetic polyneuropathy: Secondary | ICD-10-CM | POA: Diagnosis not present

## 2023-12-28 DIAGNOSIS — E113599 Type 2 diabetes mellitus with proliferative diabetic retinopathy without macular edema, unspecified eye: Secondary | ICD-10-CM | POA: Insufficient documentation

## 2023-12-28 DIAGNOSIS — Z794 Long term (current) use of insulin: Secondary | ICD-10-CM | POA: Diagnosis not present

## 2023-12-28 DIAGNOSIS — M25552 Pain in left hip: Secondary | ICD-10-CM | POA: Diagnosis present

## 2023-12-28 DIAGNOSIS — N183 Chronic kidney disease, stage 3 unspecified: Secondary | ICD-10-CM | POA: Insufficient documentation

## 2023-12-28 DIAGNOSIS — E1122 Type 2 diabetes mellitus with diabetic chronic kidney disease: Secondary | ICD-10-CM | POA: Diagnosis not present

## 2023-12-28 DIAGNOSIS — Z85828 Personal history of other malignant neoplasm of skin: Secondary | ICD-10-CM | POA: Insufficient documentation

## 2023-12-28 DIAGNOSIS — Z96642 Presence of left artificial hip joint: Secondary | ICD-10-CM

## 2023-12-28 HISTORY — PX: TOTAL HIP ARTHROPLASTY: SHX124

## 2023-12-28 LAB — GLUCOSE, CAPILLARY
Glucose-Capillary: 197 mg/dL — ABNORMAL HIGH (ref 70–99)
Glucose-Capillary: 107 mg/dL — ABNORMAL HIGH (ref 70–99)
Glucose-Capillary: 91 mg/dL (ref 70–99)
Glucose-Capillary: 257 mg/dL — ABNORMAL HIGH (ref 70–99)
Glucose-Capillary: 279 mg/dL — ABNORMAL HIGH (ref 70–99)

## 2023-12-28 LAB — ABO/RH: ABO/RH(D): A POS

## 2023-12-28 SURGERY — ARTHROPLASTY, HIP, TOTAL, ANTERIOR APPROACH
Anesthesia: Spinal | Site: Hip | Laterality: Left

## 2023-12-28 MED ORDER — OXYCODONE HCL 5 MG PO TABS: 10.0000 mg | ORAL_TABLET | ORAL | Status: DC | PRN

## 2023-12-28 MED ORDER — DOCUSATE SODIUM 100 MG PO CAPS
100.0000 mg | ORAL_CAPSULE | Freq: Two times a day (BID) | ORAL | Status: DC
  Administered 2023-12-29: 100 mg via ORAL
  Filled 2023-12-28: qty 1

## 2023-12-28 MED ORDER — LACTATED RINGERS IV SOLN: INTRAVENOUS | Status: DC

## 2023-12-28 MED ORDER — PHENOL 1.4 % MT LIQD: 1.0000 | OROMUCOSAL | Status: DC | PRN

## 2023-12-28 MED ORDER — PROPOFOL 500 MG/50ML IV EMUL
INTRAVENOUS | Status: DC | PRN
  Administered 2023-12-28: 75 ug/kg/min via INTRAVENOUS

## 2023-12-28 MED ORDER — AMLODIPINE BESYLATE 5 MG PO TABS
5.0000 mg | ORAL_TABLET | Freq: Every day | ORAL | Status: DC
  Administered 2023-12-29: 5 mg via ORAL
  Filled 2023-12-28: qty 1

## 2023-12-28 MED ORDER — HYDROMORPHONE HCL 1 MG/ML IJ SOLN: 0.2500 mg | INTRAMUSCULAR | Status: DC | PRN

## 2023-12-28 MED ORDER — ACETAMINOPHEN 325 MG PO TABS: 325.0000 mg | ORAL_TABLET | Freq: Four times a day (QID) | ORAL | Status: DC | PRN

## 2023-12-28 MED ORDER — PHENYLEPHRINE 80 MCG/ML (10ML) SYRINGE FOR IV PUSH (FOR BLOOD PRESSURE SUPPORT)
PREFILLED_SYRINGE | INTRAVENOUS | Status: DC | PRN
  Administered 2023-12-28: 80 ug via INTRAVENOUS

## 2023-12-28 MED ORDER — MENTHOL 3 MG MT LOZG: 1.0000 | LOZENGE | OROMUCOSAL | Status: DC | PRN

## 2023-12-28 MED ORDER — INSULIN ASPART 100 UNIT/ML IJ SOLN
0.0000 [IU] | Freq: Three times a day (TID) | INTRAMUSCULAR | Status: DC
  Administered 2023-12-29: 8 [IU] via SUBCUTANEOUS

## 2023-12-28 MED ORDER — TAMSULOSIN HCL 0.4 MG PO CAPS
0.4000 mg | ORAL_CAPSULE | Freq: Every day | ORAL | Status: DC
  Administered 2023-12-28: 0.4 mg via ORAL
  Filled 2023-12-28: qty 1

## 2023-12-28 MED ORDER — ALBUMIN HUMAN 5 % IV SOLN: INTRAVENOUS | Status: DC | PRN

## 2023-12-28 MED ORDER — HYDROCHLOROTHIAZIDE 12.5 MG PO TABS
12.5000 mg | ORAL_TABLET | Freq: Every morning | ORAL | Status: DC
  Administered 2023-12-29: 12.5 mg via ORAL
  Filled 2023-12-28: qty 1

## 2023-12-28 MED ORDER — SODIUM CHLORIDE 0.9 % IV SOLN: INTRAVENOUS | Status: DC

## 2023-12-28 MED ORDER — ALUM & MAG HYDROXIDE-SIMETH 200-200-20 MG/5ML PO SUSP: 30.0000 mL | ORAL | Status: DC | PRN

## 2023-12-28 MED ORDER — INSULIN ASPART 100 UNIT/ML IJ SOLN
0.0000 [IU] | Freq: Every day | INTRAMUSCULAR | Status: DC
  Administered 2023-12-28: 3 [IU] via SUBCUTANEOUS

## 2023-12-28 MED ORDER — ORAL CARE MOUTH RINSE: 15.0000 mL | Freq: Once | OROMUCOSAL | Status: AC

## 2023-12-28 MED ORDER — PREGABALIN 25 MG PO CAPS
25.0000 mg | ORAL_CAPSULE | Freq: Two times a day (BID) | ORAL | Status: DC
  Administered 2023-12-28 – 2023-12-29 (×2): 25 mg via ORAL
  Filled 2023-12-28 (×2): qty 1

## 2023-12-28 MED ORDER — FENTANYL CITRATE (PF) 250 MCG/5ML IJ SOLN
INTRAMUSCULAR | Status: AC
  Filled 2023-12-28: qty 5

## 2023-12-28 MED ORDER — ACETAMINOPHEN 10 MG/ML IV SOLN
INTRAVENOUS | Status: AC
  Filled 2023-12-28: qty 100

## 2023-12-28 MED ORDER — METOCLOPRAMIDE HCL 5 MG/ML IJ SOLN: 5.0000 mg | Freq: Three times a day (TID) | INTRAMUSCULAR | Status: DC | PRN

## 2023-12-28 MED ORDER — PIOGLITAZONE HCL 15 MG PO TABS
15.0000 mg | ORAL_TABLET | Freq: Every day | ORAL | Status: DC
  Administered 2023-12-28 – 2023-12-29 (×2): 15 mg via ORAL
  Filled 2023-12-28 (×2): qty 1

## 2023-12-28 MED ORDER — PANTOPRAZOLE SODIUM 40 MG PO TBEC
40.0000 mg | DELAYED_RELEASE_TABLET | Freq: Every day | ORAL | Status: DC
  Administered 2023-12-28 – 2023-12-29 (×2): 40 mg via ORAL
  Filled 2023-12-28 (×2): qty 1

## 2023-12-28 MED ORDER — VITAMIN C 500 MG PO TABS
500.0000 mg | ORAL_TABLET | Freq: Every day | ORAL | Status: DC
  Administered 2023-12-28 – 2023-12-29 (×2): 500 mg via ORAL
  Filled 2023-12-28 (×2): qty 1

## 2023-12-28 MED ORDER — OXYCODONE HCL 5 MG PO TABS
5.0000 mg | ORAL_TABLET | ORAL | Status: DC | PRN
  Administered 2023-12-28 (×2): 5 mg via ORAL
  Administered 2023-12-29 (×2): 10 mg via ORAL
  Filled 2023-12-28: qty 2
  Filled 2023-12-28 (×4): qty 1

## 2023-12-28 MED ORDER — METFORMIN HCL ER 500 MG PO TB24
1000.0000 mg | ORAL_TABLET | Freq: Two times a day (BID) | ORAL | Status: DC
  Administered 2023-12-28 – 2023-12-29 (×2): 1000 mg via ORAL
  Filled 2023-12-28 (×2): qty 2

## 2023-12-28 MED ORDER — SODIUM CHLORIDE 0.9 % IR SOLN
Status: DC | PRN
  Administered 2023-12-28: 1000 mL

## 2023-12-28 MED ORDER — OXYCODONE HCL 5 MG PO TABS: 5.0000 mg | ORAL_TABLET | Freq: Once | ORAL | Status: DC | PRN

## 2023-12-28 MED ORDER — EMPAGLIFLOZIN 25 MG PO TABS
25.0000 mg | ORAL_TABLET | ORAL | Status: DC
  Administered 2023-12-29: 25 mg via ORAL
  Filled 2023-12-28: qty 1

## 2023-12-28 MED ORDER — INSULIN ASPART 100 UNIT/ML IJ SOLN: 0.0000 [IU] | Freq: Three times a day (TID) | INTRAMUSCULAR | Status: DC

## 2023-12-28 MED ORDER — METHOCARBAMOL 500 MG PO TABS: 500.0000 mg | ORAL_TABLET | Freq: Four times a day (QID) | ORAL | Status: DC | PRN

## 2023-12-28 MED ORDER — TRANEXAMIC ACID-NACL 1000-0.7 MG/100ML-% IV SOLN
1000.0000 mg | INTRAVENOUS | Status: AC
  Administered 2023-12-28: 1000 mg via INTRAVENOUS
  Filled 2023-12-28: qty 100

## 2023-12-28 MED ORDER — ACETAMINOPHEN 10 MG/ML IV SOLN
INTRAVENOUS | Status: DC | PRN
Start: 2023-12-28 — End: 2023-12-28
  Administered 2023-12-28: 1000 mg via INTRAVENOUS

## 2023-12-28 MED ORDER — MEPERIDINE HCL 25 MG/ML IJ SOLN
6.2500 mg | INTRAMUSCULAR | Status: DC | PRN
  Filled 2023-12-28: qty 1

## 2023-12-28 MED ORDER — EPHEDRINE SULFATE-NACL 50-0.9 MG/10ML-% IV SOSY
PREFILLED_SYRINGE | INTRAVENOUS | Status: DC | PRN
  Administered 2023-12-28 (×2): 5 mg via INTRAVENOUS

## 2023-12-28 MED ORDER — GLIPIZIDE ER 10 MG PO TB24
10.0000 mg | ORAL_TABLET | Freq: Every day | ORAL | Status: DC
  Administered 2023-12-29: 10 mg via ORAL
  Filled 2023-12-28: qty 1

## 2023-12-28 MED ORDER — STERILE WATER FOR IRRIGATION IR SOLN
Status: DC | PRN
  Administered 2023-12-28: 1000 mL

## 2023-12-28 MED ORDER — BUPIVACAINE IN DEXTROSE 0.75-8.25 % IT SOLN
INTRATHECAL | Status: DC | PRN
Start: 2023-12-28 — End: 2023-12-28
  Administered 2023-12-28: 2 mL via INTRATHECAL

## 2023-12-28 MED ORDER — INSULIN ASPART 100 UNIT/ML IJ SOLN: 5.0000 [IU] | Freq: Every day | INTRAMUSCULAR | Status: DC

## 2023-12-28 MED ORDER — CHLORHEXIDINE GLUCONATE 0.12 % MT SOLN
15.0000 mL | Freq: Once | OROMUCOSAL | Status: AC
  Administered 2023-12-28: 15 mL via OROMUCOSAL
  Filled 2023-12-28: qty 15

## 2023-12-28 MED ORDER — POVIDONE-IODINE 10 % EX SWAB
2.0000 | Freq: Once | CUTANEOUS | Status: AC
  Administered 2023-12-28: 2 via TOPICAL

## 2023-12-28 MED ORDER — METOPROLOL SUCCINATE ER 25 MG PO TB24
25.0000 mg | ORAL_TABLET | Freq: Every day | ORAL | Status: DC
  Filled 2023-12-28: qty 1

## 2023-12-28 MED ORDER — DROPERIDOL 2.5 MG/ML IJ SOLN: 0.6250 mg | Freq: Once | INTRAMUSCULAR | Status: DC | PRN

## 2023-12-28 MED ORDER — DEXAMETHASONE SODIUM PHOSPHATE 10 MG/ML IJ SOLN
INTRAMUSCULAR | Status: DC | PRN
  Administered 2023-12-28: 5 mg via INTRAVENOUS

## 2023-12-28 MED ORDER — VITAMIN D 25 MCG (1000 UNIT) PO TABS
1000.0000 [IU] | ORAL_TABLET | Freq: Every day | ORAL | Status: DC
  Administered 2023-12-28 – 2023-12-29 (×2): 1000 [IU] via ORAL
  Filled 2023-12-28 (×2): qty 1

## 2023-12-28 MED ORDER — CEFAZOLIN SODIUM-DEXTROSE 2-4 GM/100ML-% IV SOLN
2.0000 g | Freq: Four times a day (QID) | INTRAVENOUS | Status: AC
  Administered 2023-12-28 (×2): 2 g via INTRAVENOUS
  Filled 2023-12-28 (×2): qty 100

## 2023-12-28 MED ORDER — 0.9 % SODIUM CHLORIDE (POUR BTL) OPTIME
TOPICAL | Status: DC | PRN
  Administered 2023-12-28: 1000 mL

## 2023-12-28 MED ORDER — ATORVASTATIN CALCIUM 40 MG PO TABS
40.0000 mg | ORAL_TABLET | Freq: Every evening | ORAL | Status: DC
  Administered 2023-12-28: 40 mg via ORAL
  Filled 2023-12-28: qty 1

## 2023-12-28 MED ORDER — DEXMEDETOMIDINE HCL IN NACL 80 MCG/20ML IV SOLN
INTRAVENOUS | Status: DC | PRN
  Administered 2023-12-28: 8 ug via INTRAVENOUS
  Administered 2023-12-28: 4 ug via INTRAVENOUS

## 2023-12-28 MED ORDER — OXYCODONE HCL 5 MG/5ML PO SOLN: 5.0000 mg | Freq: Once | ORAL | Status: DC | PRN

## 2023-12-28 MED ORDER — METOCLOPRAMIDE HCL 5 MG PO TABS: 5.0000 mg | ORAL_TABLET | Freq: Three times a day (TID) | ORAL | Status: DC | PRN

## 2023-12-28 MED ORDER — ONDANSETRON HCL 4 MG PO TABS: 4.0000 mg | ORAL_TABLET | Freq: Four times a day (QID) | ORAL | Status: DC | PRN

## 2023-12-28 MED ORDER — DIPHENHYDRAMINE HCL 12.5 MG/5ML PO ELIX
12.5000 mg | ORAL_SOLUTION | ORAL | Status: DC | PRN
  Administered 2023-12-28: 25 mg via ORAL
  Filled 2023-12-28: qty 10

## 2023-12-28 MED ORDER — ONDANSETRON HCL 4 MG/2ML IJ SOLN
INTRAMUSCULAR | Status: DC | PRN
Start: 2023-12-28 — End: 2023-12-28
  Administered 2023-12-28: 4 mg via INTRAVENOUS

## 2023-12-28 MED ORDER — FENTANYL CITRATE (PF) 250 MCG/5ML IJ SOLN
INTRAMUSCULAR | Status: DC | PRN
  Administered 2023-12-28 (×2): 50 ug via INTRAVENOUS

## 2023-12-28 MED ORDER — INSULIN ASPART 100 UNIT/ML IJ SOLN
0.0000 [IU] | INTRAMUSCULAR | Status: DC | PRN
  Administered 2023-12-28: 2 [IU] via SUBCUTANEOUS
  Filled 2023-12-28: qty 1

## 2023-12-28 MED ORDER — PROPOFOL 10 MG/ML IV BOLUS
INTRAVENOUS | Status: DC | PRN
  Administered 2023-12-28 (×2): 10 mg via INTRAVENOUS
  Administered 2023-12-28: 20 mg via INTRAVENOUS
  Administered 2023-12-28: 10 mg via INTRAVENOUS

## 2023-12-28 MED ORDER — ASPIRIN 81 MG PO CHEW
81.0000 mg | CHEWABLE_TABLET | Freq: Two times a day (BID) | ORAL | Status: DC
  Administered 2023-12-28 – 2023-12-29 (×2): 81 mg via ORAL
  Filled 2023-12-28 (×2): qty 1

## 2023-12-28 MED ORDER — HYDROMORPHONE HCL 1 MG/ML IJ SOLN: 0.5000 mg | INTRAMUSCULAR | Status: DC | PRN

## 2023-12-28 MED ORDER — PHENYLEPHRINE HCL-NACL 20-0.9 MG/250ML-% IV SOLN
INTRAVENOUS | Status: DC | PRN
  Administered 2023-12-28: 30 ug/min via INTRAVENOUS

## 2023-12-28 MED ORDER — ONDANSETRON HCL 4 MG/2ML IJ SOLN: 4.0000 mg | Freq: Four times a day (QID) | INTRAMUSCULAR | Status: DC | PRN

## 2023-12-28 MED ORDER — CEFAZOLIN SODIUM-DEXTROSE 2-4 GM/100ML-% IV SOLN
2.0000 g | INTRAVENOUS | Status: AC
Start: 2023-12-28 — End: 2023-12-28
  Administered 2023-12-28: 2 g via INTRAVENOUS
  Filled 2023-12-28: qty 100

## 2023-12-28 MED ORDER — ACETAMINOPHEN 10 MG/ML IV SOLN: 1000.0000 mg | Freq: Once | INTRAVENOUS | Status: DC | PRN

## 2023-12-28 MED ORDER — METHOCARBAMOL 1000 MG/10ML IJ SOLN: 500.0000 mg | Freq: Four times a day (QID) | INTRAMUSCULAR | Status: DC | PRN

## 2023-12-28 MED ORDER — LOSARTAN POTASSIUM 50 MG PO TABS
100.0000 mg | ORAL_TABLET | Freq: Every morning | ORAL | Status: DC
  Administered 2023-12-29: 100 mg via ORAL
  Filled 2023-12-28: qty 2

## 2023-12-28 SURGICAL SUPPLY — 45 items
BAG COUNTER SPONGE SURGICOUNT (BAG) ×1 IMPLANT
BENZOIN TINCTURE PRP APPL 2/3 (GAUZE/BANDAGES/DRESSINGS) ×1 IMPLANT
BLADE CLIPPER SURG (BLADE) IMPLANT
BLADE SAW SGTL 18X1.27X75 (BLADE) ×1 IMPLANT
COVER SURGICAL LIGHT HANDLE (MISCELLANEOUS) ×1 IMPLANT
DRAPE C-ARM 42X72 X-RAY (DRAPES) ×1 IMPLANT
DRAPE STERI IOBAN 125X83 (DRAPES) ×1 IMPLANT
DRAPE U-SHAPE 47X51 STRL (DRAPES) ×3 IMPLANT
DRSG AQUACEL AG ADV 3.5X10 (GAUZE/BANDAGES/DRESSINGS) ×1 IMPLANT
DURAPREP 26ML APPLICATOR (WOUND CARE) ×1 IMPLANT
ELECT BLADE 6.5 EXT (BLADE) IMPLANT
ELECTRODE BLDE 4.0 EZ CLN MEGD (MISCELLANEOUS) ×1 IMPLANT
ELECTRODE REM PT RTRN 9FT ADLT (ELECTROSURGICAL) ×1 IMPLANT
FACESHIELD WRAPAROUND OR TEAM (MASK) ×2 IMPLANT
GLOVE BIOGEL PI IND STRL 8 (GLOVE) ×2 IMPLANT
GLOVE ECLIPSE 8.0 STRL XLNG CF (GLOVE) ×1 IMPLANT
GLOVE ORTHO TXT STRL SZ7.5 (GLOVE) ×2 IMPLANT
GOWN STRL REUS W/ TWL LRG LVL3 (GOWN DISPOSABLE) ×2 IMPLANT
GOWN STRL REUS W/ TWL XL LVL3 (GOWN DISPOSABLE) ×2 IMPLANT
HEAD CERAMIC DELTA 36 PLUS 1.5 (Hips) IMPLANT
KIT BASIN OR (CUSTOM PROCEDURE TRAY) ×1 IMPLANT
KIT TURNOVER KIT B (KITS) ×1 IMPLANT
LINER NEUTRAL 52X36MM PLUS 4 (Liner) IMPLANT
MANIFOLD NEPTUNE II (INSTRUMENTS) ×1 IMPLANT
PACK TOTAL JOINT (CUSTOM PROCEDURE TRAY) ×1 IMPLANT
PAD ARMBOARD POSITIONER FOAM (MISCELLANEOUS) ×1 IMPLANT
PENCIL BUTTON HOLSTER BLD 10FT (ELECTRODE) IMPLANT
PIN SECTOR W/GRIP ACE CUP 52MM (Hips) IMPLANT
SET HNDPC FAN SPRY TIP SCT (DISPOSABLE) ×1 IMPLANT
SOLN 0.9% NACL 1000 ML (IV SOLUTION) ×1 IMPLANT
SOLN 0.9% NACL POUR BTL 1000ML (IV SOLUTION) ×1 IMPLANT
SOLN STERILE WATER 1000 ML (IV SOLUTION) ×2 IMPLANT
SOLN STERILE WATER BTL 1000 ML (IV SOLUTION) ×2 IMPLANT
STAPLER SKIN PROX 35W (STAPLE) IMPLANT
STEM FEMORAL SZ5 HIGH ACTIS (Stem) IMPLANT
STRIP CLOSURE SKIN 1/2X4 (GAUZE/BANDAGES/DRESSINGS) ×2 IMPLANT
SUT ETHIBOND NAB CT1 #1 30IN (SUTURE) ×1 IMPLANT
SUT MNCRL AB 4-0 PS2 18 (SUTURE) IMPLANT
SUT VIC AB 0 CT1 27XBRD ANBCTR (SUTURE) ×1 IMPLANT
SUT VIC AB 1 CT1 27XBRD ANBCTR (SUTURE) ×1 IMPLANT
SUT VIC AB 2-0 CT1 TAPERPNT 27 (SUTURE) ×1 IMPLANT
TOWEL GREEN STERILE (TOWEL DISPOSABLE) ×1 IMPLANT
TOWEL GREEN STERILE FF (TOWEL DISPOSABLE) ×1 IMPLANT
TRAY CATH INTERMITTENT SS 16FR (CATHETERS) IMPLANT
TRAY FOLEY W/BAG SLVR 16FR ST (SET/KITS/TRAYS/PACK) IMPLANT

## 2023-12-28 NOTE — Op Note (Signed)
 Operative Note  Date of operation: 12/28/2023 Preoperative diagnosis: Left hip primary osteoarthritis Postoperative diagnosis: Same  Procedure: Left direct anterior total hip arthroplasty  Implants: Implant Name Type Inv. Item Serial No. Manufacturer Lot No. LRB No. Used Action  PIN SECTOR W/GRIP ACE CUP - [id number] Hips PIN SECTOR W/GRIP ACE CUP  [employer]PU[employer] [id number] Left 1 Implanted  LINER NEUTRAL [id number] PLUS 4 - [id number] Liner LINER NEUTRAL [id number] PLUS 4  DEPUY ORTH[employer] [id number] Left 1 Implanted  STEM FEMORAL SZ5 HIGH ACTIS - [id number] Stem STEM FEMORAL SZ5 HIGH ACTIS  DEPUY ORTHOPAEDICS [id number] Left 1 Implanted  HEAD CERAMIC DELTA 36 PLUS 1.5 - [id number] Hips HEAD CERAMIC DELTA 36 PLUS 1.5  DEPU[employer]TH[employer] [id number] Left 1 Implanted   Surgeon: Lonni GRADE. Vernetta, MD Assistant: Tory Gaskins, PA-C  Anesthesia: Spinal EBL: 250 cc Antibiotics: IV Ancef  Complications: None  Indications: The patient is an active 73 year old gentleman with well-documented debilitating arthritis involving his left hip.  He has daily left hip pain that is detrimentally affecting his mobility, his quality of life and his actives daily living.  X-rays show bone-on-bone wear and this correlated with a CT and MRI as well.  At this point he does wish to proceed with a hip replacement we agree with this as well.  We did discuss the risks of acute blood loss anemia, nerve or vessel injury, fracture, infection, DVT, dislocation, implant failure, leg length differences and wound healing issues.  He understands that our goals are hopefully decreased pain, improved mobility and improved quality of life.  Procedure description: After informed consent was obtained appropriate left hip was marked, the patient was brought to the operating room and set up on stretcher where spinal anesthesia was obtained.  He was then laid supine position on stretcher and a Foley catheter is placed.  Traction boots  were placed on both his feet and next he was placed supine on the Hana fracture table with a perineal post and placed in both legs in inline skeletal traction devices no traction applied.  His left operative hip and pelvis were assessed radiographically.  The left hip was prepped and draped with DuraPrep and sterile drapes.  A timeout was called and he was identified as the correct patient the correct left hip.  An incision was then made just inferior and posterior to the ASIS and carried slightly obliquely down the leg.  Dissection was carried down to the tensor fascia lata muscle and the tensor fascia was divided longitudinally to proceed with a direct and approach the hip.  Circumflex vessels were identified and cauterized.  The hip capsule identified and opened up in L-type format.  Cobra retractors were placed around the medial and lateral femoral neck and a femoral neck cut was made with the oscillating saw just proximal to the lesser trochanter and this cut was completed with an osteotome.  A corkscrew guide was placed in the femoral head and the femoral head was removed in its entirety finding a wide area devoid of cartilage.  A bent Hohmann was then placed over the medial acetabular rim and remanence of the acetabular labrum and other debris removed.  Reaming was initiated from a size 43 reamer and stepwise increments going up to a size 51 reamer with all reamers placed under direct visualization and the last reamer placed under direct fluoroscopy in order to obtain the depth of reaming, the inclination and the anteversion.  The real DePuy sector GRIPTION acetabular 100 size 52 was  then placed without difficulty followed by 36+4 polythene liner.  Attention was then turned to the femur.  With the left leg externally rotated to 120 degrees, extended and adducted, a Mueller retractors placed medially contracted by the greater trochanter.  The lateral joint capsule was released and a box cutting osteotome was  used into the femoral canal.  Broaching was then initiated using the Actis broaching system from a size 0 going up to a size 5.  With a size 5 in place we trialed a standard offset femoral neck and a 36+1.5 trial head ball.  The left leg was brought over and up and with traction and internal rotation reduced in the pelvis.  Based on radiographic and clinical assessment we felt like we need to just more offset.  We dislocated the hip and remove the trial components.  We then placed the real Actis femoral component size 5 with high offset and the real 36+1.5 ceramic head ball.  Again this reduced the pelvis and replaced with range of motion, stability and offset is assessed radiographically and clinically.  Soft tissue was then irrigated with normal saline solution.  Remnants of the joint capsule were closed with interrupted #1 Ethibond suture followed by neurovascular close tensor fascia.  0 Vicryl was used to close deep tissue into the Vicryl was used to close subcutaneous tissue.  The skin was closed with staples.  An Aquacel dressing was applied.  The patient was taken the recovery room.  Tory Gaskins, PA-C did assist during the entire case and beginning to end and his assistance was crucial and medically necessary for soft tissue management and retraction, helping guide implant placement and a layered closure of the wound.

## 2023-12-28 NOTE — Evaluation (Signed)
 Physical Therapy Evaluation Patient Details Name: Ronald [name]. MRN: [id number] DOB: 1950/10/07 Today's Date: 12/28/2023  History of Present Illness  73 y.o. male presents to [facility] hospital on 12/28/2023 for elective L THA. PMH includes DM, GERD, HLF, HTN, skin cancer.  Clinical Impression  Pt presents to PT with deficits in functional mobility, gait, balance, strength, ROM. Pt is able to ambulate for household distances with support of the RW. PT provides education on the THA exercise packet and encourages frequent mobilization with staff assistance. PT will follow up tomorrow for a progression of gait and to initiate stair training.        If plan is discharge home, recommend the following: A little help with bathing/dressing/bathroom;Assistance with cooking/housework;Assist for transportation;Help with stairs or ramp for entrance   Can travel by private vehicle        Equipment Recommendations None recommended by PT  Recommendations for Other Services       Functional Status Assessment Patient has had a recent decline in their functional status and demonstrates the ability to make significant improvements in function in a reasonable and predictable amount of time.     Precautions / Restrictions Precautions Precautions: Fall Recall of Precautions/Restrictions: Intact Precaution/Restrictions Comments: direct anterior THA Restrictions Weight Bearing Restrictions Per Provider Order: Yes LLE Weight Bearing Per Provider Order: Weight bearing as tolerated      Mobility  Bed Mobility Overal bed mobility: Needs Assistance Bed Mobility: Supine to Sit     Supine to sit: Supervision          Transfers Overall transfer level: Needs assistance Equipment used: Rolling walker (2 wheels) Transfers: Sit to/from Stand Sit to Stand: Supervision           General transfer comment: cues for hand placement    Ambulation/Gait Ambulation/Gait assistance: Supervision Gait  Distance (Feet): 150 Feet Assistive device: Rolling walker (2 wheels) Gait Pattern/deviations: Step-through pattern Gait velocity: functional Gait velocity interpretation: 1.31 - 2.62 ft/sec, indicative of limited community ambulator   General Gait Details: steady step-through gait  Stairs            Wheelchair Mobility     Tilt Bed    Modified Rankin (Stroke Patients Only)       Balance Overall balance assessment: Needs assistance Sitting-balance support: No upper extremity supported, Feet supported Sitting balance-Leahy Scale: Good     Standing balance support: Single extremity supported, Reliant on assistive device for balance Standing balance-Leahy Scale: Poor                               Pertinent Vitals/Pain Pain Assessment Pain Assessment: 0-10 Pain Score: 5  Pain Location: L thigh Pain Descriptors / Indicators: Sore Pain Intervention(s): Premedicated before session    Home Living Family/patient expects to be discharged to:: Private residence Living Arrangements: Spouse/significant other Available Help at Discharge: Family;Available 24 hours/day Type of Home: Other(Comment) (Double Wide) Home Access: Stairs to enter Entrance Stairs-Rails: Can reach both Entrance Stairs-Number of Steps: 5   Home Layout: One level Home Equipment: Agricultural consultant (2 wheels);Cane - single point;BSC/3in1;Shower seat      Prior Function Prior Level of Function : Independent/Modified Independent;Working/employed;Driving             Mobility Comments: working as a Education administrator at [employer] Extremity Assessment Upper Extremity Assessment: Overall [facility] for tasks assessed    Lower  Extremity Assessment Lower Extremity Assessment: LLE deficits/detail LLE Deficits / Details: generalized post-op weakness as anticipated on POD 0    Cervical / Trunk Assessment Cervical / Trunk Assessment: Normal  Communication    Communication Communication: No apparent difficulties    Cognition Arousal: Alert Behavior During Therapy: WFL for tasks assessed/performed   PT - Cognitive impairments: No apparent impairments                         Following commands: Intact       Cueing Cueing Techniques: Verbal cues     General Comments General comments (skin integrity, edema, etc.): VSS on RA    Exercises Other Exercises Other Exercises: PT provides education on the surgical hip exercise packet   Assessment/Plan    PT Assessment Patient needs continued PT services  PT Problem List Decreased strength;Decreased range of motion;Decreased balance;Decreased activity tolerance;Decreased mobility;Decreased knowledge of use of DME;Pain       PT Treatment Interventions DME instruction;Gait training;Stair training;Functional mobility training;Therapeutic activities;Therapeutic exercise;Balance training;Neuromuscular re-education;Patient/family education    PT Goals (Current goals can be found in the Care Plan section)  Acute Rehab PT Goals Patient Stated Goal: to return to independence PT Goal Formulation: With patient Time For Goal Achievement: 01/01/24 Potential to Achieve Goals: Good    Frequency 7X/week     Co-evaluation               AM-PAC PT 6 Clicks Mobility  Outcome Measure Help needed turning from your back to your side while in a flat bed without using bedrails?: A Little Help needed moving from lying on your back to sitting on the side of a flat bed without using bedrails?: A Little Help needed moving to and from a bed to a chair (including a wheelchair)?: A Little Help needed standing up from a chair using your arms (e.g., wheelchair or bedside chair)?: A Little Help needed to walk in hospital room?: A Little Help needed climbing 3-5 steps with a railing? : A Little 6 Click Score: 18    End of Session Equipment Utilized During Treatment: Gait belt Activity Tolerance:  Patient tolerated treatment well Patient left: in chair;with call bell/phone within reach;with family/visitor present Nurse Communication: Mobility status PT Visit Diagnosis: Other abnormalities of gait and mobility (R26.89);Muscle weakness (generalized) (M62.81);Pain Pain - Right/Left: Left Pain - part of body: Hip    Time: 8379-8351 PT Time Calculation (min) (ACUTE ONLY): 28 min   Charges:   PT Evaluation $PT Eval Low Complexity: 1 Low   PT General Charges $$ ACUTE PT VISIT: 1 Visit         Bernardino JINNY Ruth, PT, DPT Acute Rehabilitation Office [phone]   Bernardino JINNY Ruth 12/28/2023, 4:58 PM

## 2023-12-28 NOTE — Discharge Instructions (Signed)
 Per [facility] clinic policy, our goal is ensure optimal postoperative pain control with a multimodal pain management strategy. For all [facility] patients, our goal is to wean post-operative narcotic medications by 6 weeks post-operatively. If this is not possible due to utilization of pain medication prior to surgery, your [facility] doctor will support your acute post-operative pain control for the first 6 weeks postoperatively, with a plan to transition you back to your primary pain team following that. Ronald [name] will work to ensure a Therapist, occupational.  INSTRUCTIONS AFTER JOINT REPLACEMENT   Remove items at home which could result in a fall. This includes throw rugs or furniture in walking pathways ICE to the affected joint every three hours while awake for 30 minutes at a time, for at least the first 3-5 days, and then as needed for pain and swelling.  Continue to use ice for pain and swelling. You may notice swelling that will progress down to the foot and ankle.  This is normal after surgery.  Elevate your leg when you are not up walking on it.   Continue to use the breathing machine you got in the hospital (incentive spirometer) which will help keep your temperature down.  It is common for your temperature to cycle up and down following surgery, especially at night when you are not up moving around and exerting yourself.  The breathing machine keeps your lungs expanded and your temperature down.   DIET:  As you were doing prior to hospitalization, we recommend a well-balanced diet.  DRESSING / WOUND CARE / SHOWERING  Keep the surgical dressing until follow up.  The dressing is water  proof, so you can shower without any extra covering.  IF THE DRESSING FALLS OFF or the wound gets wet inside, change the dressing with sterile gauze.  Please use good hand washing techniques before changing the dressing.  Do not use any lotions or creams on the incision until instructed by your surgeon.     ACTIVITY  Increase activity slowly as tolerated, but follow the weight bearing instructions below.   No driving for 6 weeks or until further direction given by your physician.  You cannot drive while taking narcotics.  No lifting or carrying greater than 10 lbs. until further directed by your surgeon. Avoid periods of inactivity such as sitting longer than an hour when not asleep. This helps prevent blood clots.  You may return to work once you are authorized by your doctor.     WEIGHT BEARING   Weight bearing as tolerated with assist device (walker, cane, etc) as directed, use it as long as suggested by your surgeon or therapist, typically at least 4-6 weeks.   EXERCISES  Results after joint replacement surgery are often greatly improved when you follow the exercise, range of motion and muscle strengthening exercises prescribed by your doctor. Safety measures are also important to protect the joint from further injury. Any time any of these exercises cause you to have increased pain or swelling, decrease what you are doing until you are comfortable again and then slowly increase them. If you have problems or questions, call your caregiver or physical therapist for advice.   Rehabilitation is important following a joint replacement. After just a few days of immobilization, the muscles of the leg can become weakened and shrink (atrophy).  These exercises are designed to build up the tone and strength of the thigh and leg muscles and to improve motion. Often times heat used for twenty to thirty minutes before  working out will loosen up your tissues and help with improving the range of motion but do not use heat for the first two weeks following surgery (sometimes heat can increase post-operative swelling).   These exercises can be done on a training (exercise) mat, on the floor, on a table or on a bed. Use whatever works the best and is most comfortable for you.    Use music or television  while you are exercising so that the exercises are a pleasant break in your day. This will make your life better with the exercises acting as a break in your routine that you can look forward to.   Perform all exercises about fifteen times, three times per day or as directed.  You should exercise both the operative leg and the other leg as well.  Exercises include:   Quad Sets - Tighten up the muscle on the front of the thigh (Quad) and hold for 5-10 seconds.   Straight Leg Raises - With your knee straight (if you were given a brace, keep it on), lift the leg to 60 degrees, hold for 3 seconds, and slowly lower the leg.  Perform this exercise against resistance later as your leg gets stronger.  Leg Slides: Lying on your back, slowly slide your foot toward your buttocks, bending your knee up off the floor (only go as far as is comfortable). Then slowly slide your foot back down until your leg is flat on the floor again.  Angel Wings: Lying on your back spread your legs to the side as far apart as you can without causing discomfort.  Hamstring Strength:  Lying on your back, push your heel against the floor with your leg straight by tightening up the muscles of your buttocks.  Repeat, but this time bend your knee to a comfortable angle, and push your heel against the floor.  You may put a pillow under the heel to make it more comfortable if necessary.   A rehabilitation program following joint replacement surgery can speed recovery and prevent re-injury in the future due to weakened muscles. Contact your doctor or a physical therapist for more information on knee rehabilitation.    CONSTIPATION  Constipation is defined medically as fewer than three stools per week and severe constipation as less than one stool per week.  Even if you have a regular bowel pattern at home, your normal regimen is likely to be disrupted due to multiple reasons following surgery.  Combination of anesthesia, postoperative  narcotics, change in appetite and fluid intake all can affect your bowels.   YOU MUST use at least one of the following options; they are listed in order of increasing strength to get the job done.  They are all available over the counter, and you may need to use some, POSSIBLY even all of these options:    Drink plenty of fluids (prune juice may be helpful) and high fiber foods Colace 100 mg by mouth twice a day  Senokot for constipation as directed and as needed Dulcolax (bisacodyl), take with full glass of water  Miralax (polyethylene glycol) once or twice a day as needed.  If you have tried all these things and are unable to have a bowel movement in the first 3-4 days after surgery call either your surgeon or your primary doctor.    If you experience loose stools or diarrhea, hold the medications until you stool forms back up.  If your symptoms do not get better within 1 week  or if they get worse, check with your doctor.  If you experience the worst abdominal pain ever or develop nausea or vomiting, please contact the office immediately for further recommendations for treatment.   ITCHING:  If you experience itching with your medications, try taking only a single pain pill, or even half a pain pill at a time.  You can also use Benadryl  over the counter for itching or also to help with sleep.   TED HOSE STOCKINGS:  Use stockings on both legs until for at least 2 weeks or as directed by physician office. They may be removed at night for sleeping.  MEDICATIONS:  See your medication summary on the "After Visit Summary" that nursing will review with you.  You may have some home medications which will be placed on hold until you complete the course of blood thinner medication.  It is important for you to complete the blood thinner medication as prescribed.  PRECAUTIONS:  If you experience chest pain or shortness of breath - call 911 immediately for transfer to the hospital emergency department.    If you develop a fever greater that 101 F, purulent drainage from wound, increased redness or drainage from wound, foul odor from the wound/dressing, or calf pain - CONTACT YOUR SURGEON.                                                   FOLLOW-UP APPOINTMENTS:  If you do not already have a post-op appointment, please call the office for an appointment to be seen by your surgeon.  Guidelines for how soon to be seen are listed in your "After Visit Summary", but are typically between 1-4 weeks after surgery.  OTHER INSTRUCTIONS:   Knee Replacement:  Do not place pillow under knee, focus on keeping the knee straight while resting. CPM instructions: 0-90 degrees, 2 hours in the morning, 2 hours in the afternoon, and 2 hours in the evening. Place foam block, curve side up under heel at all times except when in CPM or when walking.  DO NOT modify, tear, cut, or change the foam block in any way.  POST-OPERATIVE OPIOID TAPER INSTRUCTIONS: It is important to wean off of your opioid medication as soon as possible. If you do not need pain medication after your surgery it is ok to stop day one. Opioids include: Codeine, Hydrocodone(Norco, Vicodin), Oxycodone (Percocet, oxycontin ) and hydromorphone  amongst others.  Long term and even short term use of opiods can cause: Increased pain response Dependence Constipation Depression Respiratory depression And more.  Withdrawal symptoms can include Flu like symptoms Nausea, vomiting And more Techniques to manage these symptoms Hydrate well Eat regular healthy meals Stay active Use relaxation techniques(deep breathing, meditating, yoga) Do Not substitute Alcohol to help with tapering If you have been on opioids for less than two weeks and do not have pain than it is ok to stop all together.  Plan to wean off of opioids This plan should start within one week post op of your joint replacement. Maintain the same interval or time between taking each dose  and first decrease the dose.  Cut the total daily intake of opioids by one tablet each day Next start to increase the time between doses. The last dose that should be eliminated is the evening dose.   MAKE SURE YOU:  Understand these instructions.  Get help right away if you are not doing well or get worse.    Thank you for letting us  be a part of your medical care team.  It is a privilege we respect greatly.  We hope these instructions will help you stay on track for a fast and full recovery!     Dental Antibiotics:  In most cases prophylactic antibiotics for Dental procdeures after total joint surgery are not necessary.  Exceptions are as follows:  1. History of prior total joint infection  2. Severely immunocompromised (Organ Transplant, cancer chemotherapy, Rheumatoid biologic meds such as Humera)  3. Poorly controlled diabetes (A1C &gt; 8.0, blood glucose over 200)  If you have one of these conditions, contact your surgeon for an antibiotic prescription, prior to your dental procedure.

## 2023-12-28 NOTE — Transfer of Care (Signed)
 Immediate Anesthesia Transfer of Care Note  Patient: Ronald [name].  Procedure(s) Performed: ARTHROPLASTY, HIP, TOTAL, ANTERIOR APPROACH (Left: Hip)  Patient Location: PACU  Anesthesia Type:Spinal  Level of Consciousness: awake  Airway & Oxygen Therapy: Patient Spontanous Breathing  Post-op Assessment: Report given to RN and Post -op Vital signs reviewed and stable  Post vital signs: Reviewed and stable  Last Vitals:  Vitals Value Taken Time  BP 105/57 12/28/23 12:18  Temp    Pulse 75 12/28/23 12:24  Resp 13 12/28/23 12:24  SpO2 93 % 12/28/23 12:24  Vitals shown include unfiled device data.  Last Pain:  Vitals:   12/28/23 0907  PainSc: 5       Patients Stated Pain Goal: 0 (12/28/23 0907)  Complications: No notable events documented.

## 2023-12-28 NOTE — Interval H&P Note (Signed)
 History and Physical Interval Note: The patient understands that he is here today for a left total hip replacement to treat his significant left hip pain and arthritis.  There has been no acute or interval change in his medical status.  The risks and benefits of surgery have been discussed in detail and informed consent has been obtained.  The left operative hip has been marked.  12/28/2023 9:12 AM  Ronald [name].  has presented today for surgery, with the diagnosis of osteoarthritis left hip.  The various methods of treatment have been discussed with the patient and family. After consideration of risks, benefits and other options for treatment, the patient has consented to  Procedure(s): ARTHROPLASTY, HIP, TOTAL, ANTERIOR APPROACH (Left) as a surgical intervention.  The patient's history has been reviewed, patient examined, no change in status, stable for surgery.  I have reviewed the patient's chart and labs.  Questions were answered to the patient's satisfaction.     [name]

## 2023-12-28 NOTE — Anesthesia Procedure Notes (Signed)
 Spinal  Start time: 12/28/2023 10:49 AM End time: 12/28/2023 10:51 AM Reason for block: surgical anesthesia Staffing Performed: anesthesiologist  Anesthesiologist: Tilford Franky BIRCH, MD Performed by: Tilford Franky BIRCH, MD Authorized by: Tilford Franky BIRCH, MD   Preanesthetic Checklist Completed: patient identified, IV checked, site marked, risks and benefits discussed, surgical consent, monitors and equipment checked, pre-op evaluation and timeout performed Spinal Block Patient position: sitting Prep: DuraPrep and site prepped and draped Location: L3-4 Injection technique: single-shot Needle Needle type: Pencan  Needle gauge: 24 G Needle length: 10 cm Needle insertion depth: 10 cm Assessment Events: CSF return Additional Notes Patient tolerated well. No immediate complications.  Functioning IV was confirmed and monitors were applied. Sterile prep and drape, including hand hygiene and sterile gloves were used. The patient was positioned and the back was prepped. The skin was anesthetized with lidocaine . Free flow of clear CSF was obtained prior to injecting local anesthetic into the CSF. The spinal needle aspirated freely following injection. The needle was carefully withdrawn. The patient tolerated the procedure well.;

## 2023-12-28 NOTE — TOC Initial Note (Signed)
 Transition of Care (TOC) - Initial/Assessment Note   Spoke to patient and wife at bedside.   MD office arranged home helath with [facility] patient in agreement. Confined with Lynette with [employer].   MD ordered rolling walker and bedside commode. Patient's wife has both pieces of equipment at home from her hip surgeries and is not using them now. Patient does not want NCM to order  Patient Details  Name: Ronald [name]. MRN: [id number] Date of Birth: 1950/09/06  Transition of Care [facility]) CM/SW Contact:    Stephane [name] Jansky, RN Phone Number: 12/28/2023, 4:15 PM  Clinical Narrative:                   Expected Discharge Plan: Home w Home Health Services Barriers to Discharge: Continued Medical Work up   Patient Goals and CMS Choice Patient states their goals for this hospitalization and ongoing recovery are:: to return to home CMS Medicare.gov Compare Post Acute Care list provided to:: Patient Choice offered to / list presented to : Patient      Expected Discharge Plan and Services   Discharge Planning Services: CM Consult Post Acute Care Choice: Home Health, Durable Medical Equipment Living arrangements for the past 2 months: Single Family Home                 DME Arranged: N/A         HH Arranged: PT HH Agency: [facility] Date HH Agency Contacted: 12/28/23 Time HH Agency Contacted: 1614 Representative spoke with at [facility] Agency: Arna  Prior Living Arrangements/Services Living arrangements for the past 2 months: Single Family Home Lives with:: Spouse Patient language and need for interpreter reviewed:: Yes Do you feel safe going back to the place where you live?: Yes      Need for Family Participation in Patient Care: Yes (Comment) Care giver support system in place?: Yes (comment) Current home services: DME Criminal Activity/Legal Involvement Pertinent to Current Situation/Hospitalization: No - Comment as needed  Activities of Daily Living   ADL  Screening (condition at time of admission) Independently performs ADLs?: Yes (appropriate for developmental age) Is the patient deaf or have difficulty hearing?: No Does the patient have difficulty seeing, even when wearing glasses/contacts?: No Does the patient have difficulty concentrating, remembering, or making decisions?: No  Permission Sought/Granted   Permission granted to share information with : Yes, Verbal Permission Granted  Share Information with NAME: wife  Permission granted to share info w AGENCY: [facility]        Emotional Assessment Appearance:: Appears stated age Attitude/Demeanor/Rapport: Engaged Affect (typically observed): Appropriate Orientation: : Oriented to Self, Oriented to Place, Oriented to  Time, Oriented to Situation Alcohol / Substance Use: Not Applicable Psych Involvement: No (comment)  Admission diagnosis:  Primary osteoarthritis of left hip [M16.12] 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] 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   Unilateral primary osteoarthritis, left hip 12/13/2023   Biceps tendon rupture, proximal, right, initial encounter 02/05/2023   Aortic stenosis 12/28/2022   Lumbar radiculopathy 11/02/2022   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 AT [facility] [address] [place] [address] Phone: [phone] Fax: [phone]  [facility] - [address], [place] - [address] [place] [address] Phone: [phone] Fax: [phone]     Social Drivers of Health (SDOH) Social History: SDOH Screenings   Food Insecurity: No Food Insecurity (12/28/2023)  Housing: Unknown (12/28/2023)  Transportation Needs: Unknown (12/28/2023)  Utilities: Not At Risk (12/28/2023)  Social Connections: Unknown (08/01/2021)   Received from [facility]  Tobacco Use: Medium Risk (12/24/2023)   SDOH Interventions:     Readmission Risk Interventions     No data to display

## 2023-12-29 ENCOUNTER — Encounter (HOSPITAL_COMMUNITY): Payer: Self-pay | Admitting: Orthopaedic Surgery

## 2023-12-29 DIAGNOSIS — M1612 Unilateral primary osteoarthritis, left hip: Secondary | ICD-10-CM | POA: Diagnosis not present

## 2023-12-29 LAB — BASIC METABOLIC PANEL WITH GFR
Anion gap: 15 (ref 5–15)
BUN: 22 mg/dL (ref 8–23)
CO2: 20 mmol/L — ABNORMAL LOW (ref 22–32)
Calcium: 8.7 mg/dL — ABNORMAL LOW (ref 8.9–10.3)
Chloride: 99 mmol/L (ref 98–111)
Creatinine, Ser: 1.04 mg/dL (ref 0.61–1.24)
GFR, Estimated: >60 mL/min (ref >60)
Glucose, Bld: 249 mg/dL — ABNORMAL HIGH (ref 70–99)
Potassium: 4.2 mmol/L (ref 3.5–5.1)
Sodium: 134 mmol/L — ABNORMAL LOW (ref 135–145)

## 2023-12-29 LAB — CBC
HCT: 41 % (ref 39.0–52.0)
Hemoglobin: 13.9 g/dL (ref 13.0–17.0)
MCH: 30.3 pg (ref 26.0–34.0)
MCHC: 33.9 g/dL (ref 30.0–36.0)
MCV: 89.5 fL (ref 80.0–100.0)
Platelets: 207 K/uL (ref 150–400)
RBC: 4.58 MIL/uL (ref 4.22–5.81)
RDW: 13 % (ref 11.5–15.5)
WBC: 10.5 K/uL (ref 4.0–10.5)
nRBC: 0 % (ref 0.0–0.2)

## 2023-12-29 LAB — GLUCOSE, CAPILLARY: Glucose-Capillary: 295 mg/dL — ABNORMAL HIGH (ref 70–99)

## 2023-12-29 MED ORDER — ASPIRIN 81 MG PO CHEW: 81.0000 mg | CHEWABLE_TABLET | Freq: Two times a day (BID) | ORAL | 0 refills | Status: AC

## 2023-12-29 MED ORDER — OXYCODONE HCL 5 MG PO TABS: 5.0000 mg | ORAL_TABLET | Freq: Four times a day (QID) | ORAL | 0 refills | Status: DC | PRN

## 2023-12-29 MED ORDER — TIZANIDINE HCL 4 MG PO TABS: 4.0000 mg | ORAL_TABLET | Freq: Four times a day (QID) | ORAL | 0 refills | Status: DC | PRN

## 2023-12-29 NOTE — Progress Notes (Signed)
 Physical Therapy Treatment Patient Details Name: Ronald [name]. MRN: [id number] DOB: [birth year] Today's Date: 12/29/2023   History of Present Illness 73 y.o. male presents to [facility] on 12/[facility]/2023 for elective L THA. PMH includes DM, GERD, HLF, HTN, skin cancer.    PT Comments  Pt greeted supine in bed, pleasant and agreeable to PT session. He engaged in stair training in accordance with his home set-up. Educated pt to ascend with RLE and descend with LLE using a step-to pattern. He completed 6 stairs twice with BUE support on railings and CGA. Pt increased gait distance, ambulating ~240ft using RW with a step-through antalgic gait pattern. He took intermittent standing rest breaks secondary to pain. Pt demonstrated limited L hip flex, L knee flex, and L foot clearance. He engaged in standing BLE exercises to advance AROM, challenge single-leg balance, and maintain strength. Pt and Wife feel ready and safe for discharge home with HHPT. I have answered all their questions related to mobility.      If plan is discharge home, recommend the following: A little help with bathing/dressing/bathroom;Assistance with cooking/housework;Assist for transportation;Help with stairs or ramp for entrance   Can travel by private vehicle        Equipment Recommendations  None recommended by PT    Recommendations for Other Services       Precautions / Restrictions Precautions Precautions: Fall Recall of Precautions/Restrictions: Intact Precaution/Restrictions Comments: Direct anterior approach, no hip precautions. Restrictions Weight Bearing Restrictions Per Provider Order: Yes LLE Weight Bearing Per Provider Order: Weight bearing as tolerated     Mobility  Bed Mobility Overal bed mobility: Needs Assistance Bed Mobility: Rolling, Sidelying to Sit Rolling: Supervision Sidelying to sit: Supervision       General bed mobility comments: Pt sat up on R side of bed with increased time. He  completed log roll technique from a flat bed.    Transfers Overall transfer level: Needs assistance Equipment used: Rolling walker (2 wheels) Transfers: Sit to/from Stand, Bed to chair/wheelchair/BSC Sit to Stand: Supervision   Step pivot transfers: Supervision       General transfer comment: Pt stood from lowest bed height. Cued proper hand placement. Pt scooted to edge of surface and powered up without physical assist. Supervision for safety. Transferred to recliner chair. Good eccentric control.    Ambulation/Gait Ambulation/Gait assistance: Supervision Gait Distance (Feet): 200 Feet Assistive device: Rolling walker (2 wheels) Gait Pattern/deviations: Step-through pattern, Decreased stance time - left, Decreased dorsiflexion - left Gait velocity: decreased Gait velocity interpretation: 1.31 - 2.62 ft/sec, indicative of limited community ambulator   General Gait Details: Pt ambulated with short steady steps. Limited L hip/knee flex and minimal L foot clearence. Cues for proximity to RW and to relax shoulders. Pt navigated room/hallway well, no LOB.   Stairs Stairs: Yes Stairs assistance: Contact guard assist Stair Management: Two rails, Forwards, Step to pattern Number of Stairs: 6 (x2) General stair comments: Instructed pt to ascend with RLE and descend with LLE one step at a time. He maintained BUE support on railing. Pt completed 6 steps twice. Discussed how his wife should be positioned at home to support him.   Wheelchair Mobility     Tilt Bed    Modified Rankin (Stroke Patients Only)       Balance Overall balance assessment: Needs assistance Sitting-balance support: No upper extremity supported, Feet supported Sitting balance-Leahy Scale: Good     Standing balance support: Bilateral upper extremity supported, During functional activity, Reliant on  assistive device for balance Standing balance-Leahy Scale: Poor Standing balance comment: Pt dependent on RW                             Communication Communication Communication: No apparent difficulties  Cognition Arousal: Alert Behavior During Therapy: WFL for tasks assessed/performed   PT - Cognitive impairments: No apparent impairments                         Following commands: Intact      Cueing Cueing Techniques: Verbal cues, Gestural cues  Exercises Total Joint Exercises Hip ABduction/ADduction: Standing, Both, AROM, 10 reps Marching in Standing: Standing, Both, AROM, 10 reps Standing Hip Extension: Standing, Both, AROM, 10 reps    General Comments General comments (skin integrity, edema, etc.): VSS on RA. Reviewed L THA HEP. Encouraged pt to complete exercises 2 more times today and ambulate with staff assistance.      Pertinent Vitals/Pain Pain Assessment Pain Assessment: Faces Faces Pain Scale: Hurts even more Pain Location: L hip Pain Descriptors / Indicators: Discomfort, Aching, Sore Pain Intervention(s): Limited activity within patient's tolerance, Monitored during session, RN gave pain meds during session    Home Living                          Prior Function            PT Goals (current goals can now be found in the care plan section) Acute Rehab PT Goals Patient Stated Goal: Return home and regain independence PT Goal Formulation: With patient Time For Goal Achievement: 01/01/24 Potential to Achieve Goals: Good Progress towards PT goals: Progressing toward goals    Frequency    7X/week      PT Plan      Co-evaluation              AM-PAC PT 6 Clicks Mobility   Outcome Measure  Help needed turning from your back to your side while in a flat bed without using bedrails?: A Little Help needed moving from lying on your back to sitting on the side of a flat bed without using bedrails?: A Little Help needed moving to and from a bed to a chair (including a wheelchair)?: A Little Help needed standing up from a  chair using your arms (e.g., wheelchair or bedside chair)?: A Little Help needed to walk in hospital room?: A Little Help needed climbing 3-5 steps with a railing? : A Little 6 Click Score: 18    End of Session Equipment Utilized During Treatment: Gait belt Activity Tolerance: Patient tolerated treatment well Patient left: in chair;with call bell/phone within reach;with family/visitor present Nurse Communication: Mobility status PT Visit Diagnosis: Other abnormalities of gait and mobility (R26.89);Muscle weakness (generalized) (M62.81);Pain Pain - Right/Left: Left Pain - part of body: Hip     Time: 0821-0846 PT Time Calculation (min) (ACUTE ONLY): 25 min  Charges:    $Gait Training: 8-22 mins $Therapeutic Exercise: 8-22 mins PT General Charges $$ ACUTE PT VISIT: 1 Visit                     Randall SAUNDERS, PT, DPT Acute Rehabilitation Services Office: [phone] Secure Chat Preferred  Ronald [name] 12/29/2023, 9:22 AM

## 2023-12-29 NOTE — Plan of Care (Signed)
  Problem: Education: Goal: Knowledge of General Education information will improve Description: Including pain rating scale, medication(s)/side effects and non-pharmacologic comfort measures Outcome: Adequate for Discharge   Problem: Activity: Goal: Risk for activity intolerance will decrease Outcome: Adequate for Discharge   Problem: Nutrition: Goal: Adequate nutrition will be maintained Outcome: Adequate for Discharge   Problem: Clinical Measurements: Goal: Ability to maintain clinical measurements within normal limits will improve Outcome: Adequate for Discharge Goal: Will remain free from infection Outcome: Adequate for Discharge Goal: Diagnostic test results will improve Outcome: Adequate for Discharge Goal: Respiratory complications will improve Outcome: Adequate for Discharge Goal: Cardiovascular complication will be avoided Outcome: Adequate for Discharge   Problem: Nutrition: Goal: Adequate nutrition will be maintained Outcome: Adequate for Discharge   Problem: Coping: Goal: Level of anxiety will decrease Outcome: Adequate for Discharge   Problem: Elimination: Goal: Will not experience complications related to bowel motility Outcome: Adequate for Discharge Goal: Will not experience complications related to urinary retention Outcome: Adequate for Discharge   Problem: Pain Managment: Goal: General experience of comfort will improve and/or be controlled Outcome: Adequate for Discharge   Problem: Safety: Goal: Ability to remain free from injury will improve Outcome: Adequate for Discharge   Problem: Skin Integrity: Goal: Risk for impaired skin integrity will decrease Outcome: Adequate for Discharge   Problem: Education: Goal: Knowledge of the prescribed therapeutic regimen will improve Outcome: Adequate for Discharge Goal: Understanding of discharge needs will improve Outcome: Adequate for Discharge Goal: Individualized Educational Video(s) Outcome:  Adequate for Discharge   Problem: Activity: Goal: Ability to avoid complications of mobility impairment will improve Outcome: Adequate for Discharge Goal: Ability to tolerate increased activity will improve Outcome: Adequate for Discharge   Problem: Clinical Measurements: Goal: Postoperative complications will be avoided or minimized Outcome: Adequate for Discharge   Problem: Pain Management: Goal: Pain level will decrease with appropriate interventions Outcome: Adequate for Discharge   Problem: Skin Integrity: Goal: Will show signs of wound healing Outcome: Adequate for Discharge

## 2023-12-29 NOTE — Progress Notes (Signed)
 Explained discharge instructions to patient. Reviewed follow up appointment and next medication administration times. Also reviewed education. Patient verbalized having an understanding for instructions given. All belongings are in the patient's possession. No other needs verbalized. Will transport downstairs for discharge.

## 2023-12-29 NOTE — Discharge Summary (Signed)
 Patient ID: Ronald [name]. MRN: [id number] DOB/AGE: 74-03-1950 74 y.o.  Admit date: 12/28/2023 Discharge date: 12/29/2023  Admission Diagnoses:  Principal Problem:   Unilateral primary osteoarthritis, left hip Active Problems:   Status post total replacement of left hip   Discharge Diagnoses:  Same  Past Medical History:  Diagnosis Date   Arthritis    Left Hip   Carpal tunnel syndrome    Diabetes mellitus without complication (HCC)    Diabetic peripheral neuropathy (HCC)    Diabetic renal disease (HCC)    Elevated hemoglobin    Esophageal polyp    GERD (gastroesophageal reflux disease)    Heart murmur    mild AS 07/16/22   HLD (hyperlipidemia)    Hyperglycemia due to type 2 diabetes mellitus (HCC)    Hyperplasia of prostate    Hypertension    Hypertensive retinopathy    Hypogonadism in male    Kidney disease, chronic, stage III (GFR 30-59 ml/min) (HCC)    Rotator cuff tear    Skin cancer    removed from left arm   Thrombocytosis    Trigger finger     Surgeries: Procedure(s): ARTHROPLASTY, HIP, TOTAL, ANTERIOR APPROACH on 12/28/2023   Consultants:   Discharged Condition: Improved  Hospital Course: [name]. is an 73 y.o. male who was admitted 12/28/2023 for operative treatment ofUnilateral primary osteoarthritis, left hip. Patient has severe unremitting pain that affects sleep, daily activities, and work/hobbies. After pre-op clearance the patient was taken to the operating room on 12/28/2023 and underwent  Procedure(s): ARTHROPLASTY, HIP, TOTAL, ANTERIOR APPROACH.    Patient was given perioperative antibiotics:  Anti-infectives (From admission, onward)    Start     Dose/Rate Route Frequency Ordered Stop   12/28/23 1645  ceFAZolin  (ANCEF ) IVPB 2g/100 mL premix        2 g 200 mL/hr over 30 Minutes Intravenous Every 6 hours 12/28/23 1545 12/29/23 0830   12/28/23 0815  ceFAZolin  (ANCEF ) IVPB 2g/100 mL premix        2 g 200 mL/hr over 30 Minutes  Intravenous On call to O.R. 12/28/23 0814 12/28/23 1122        Patient was given sequential compression devices, early ambulation, and chemoprophylaxis to prevent DVT.  Inpatient Morphine Milligram Equivalents Per Day 10/7 - 10/8   Values displayed are in units of MME/Day    Order Start / End Date Yesterday Today    oxyCODONE  (Oxy IR/ROXICODONE ) immediate release tablet 5 mg 10/7 - 10/7 0 of Unknown --    oxyCODONE  (ROXICODONE ) 5 MG/5ML solution 5 mg 10/7 - 10/7 0 of Unknown --      Group total: 0 of Unknown     fentaNYL  citrate (PF) (SUBLIMAZE ) injection 10/7 - 10/7 *30 of 30 --    HYDROmorphone  (DILAUDID ) injection 0.25-0.5 mg 10/7 - 10/7 0 of 40-80 --    meperidine  (DEMEROL ) injection 6.25-12.5 mg 10/7 - 10/7 0 of 7.5-15 --    Daily Totals  * 30 of Unknown (at least 77.5-125) --  *One-Step medication  Calculation Errors     Order Type Date Details   oxyCODONE  (Oxy IR/ROXICODONE ) immediate release tablet 5 mg Ordered Dose -- Insufficient frequency information   oxyCODONE  (ROXICODONE ) 5 MG/5ML solution 5 mg Ordered Dose -- Insufficient frequency information            Patient benefited maximally from hospital stay and there were no complications.    Recent vital signs: Patient Vitals for the past 24 hrs:  BP Temp Temp src Pulse Resp SpO2  12/29/23 0811 (!) 112/54 98.4 F (36.9 C) Oral (!) 110 18 96 %  12/29/23 0451 124/64 98.7 F (37.1 C) Oral -- 18 98 %  12/29/23 0131 123/62 98.6 F (37 C) Oral 95 18 98 %  12/28/23 2123 123/60 97.8 F (36.6 C) Oral (!) 104 18 95 %  12/28/23 1509 114/60 97.8 F (36.6 C) Oral 84 16 95 %  12/28/23 1445 (!) 111/59 -- -- 81 16 94 %  12/28/23 1430 (!) 105/46 -- -- 77 14 94 %  12/28/23 1415 (!) 113/50 98 F (36.7 C) -- 75 15 94 %  12/28/23 1400 (!) 104/59 -- -- 77 19 94 %  12/28/23 1345 (!) 102/59 -- -- 76 13 92 %  12/28/23 1330 (!) 105/57 -- -- 72 13 94 %  12/28/23 1315 (!) 103/58 -- -- 72 12 95 %  12/28/23 1300 (!) 101/58 -- -- 75 13  94 %  12/28/23 1245 (!) 95/51 -- -- 73 13 92 %  12/28/23 1230 (!) 92/53 -- -- 74 14 95 %  12/28/23 1219 (!) 105/57 -- -- 79 -- 96 %  12/28/23 1217 104/72 98 F (36.7 C) -- 76 14 96 %     Recent laboratory studies:  Recent Labs    12/29/23 0427  WBC 10.5  HGB 13.9  HCT 41.0  PLT 207  NA 134*  K 4.2  CL 99  CO2 20*  BUN 22  CREATININE 1.04  GLUCOSE 249*  CALCIUM  8.7*     Discharge Medications:   Allergies as of 12/29/2023       Reactions   Aspirin Other (See Comments)   Pt unsure- takes low dose aspirin   Penicillins Hives   As a child        Medication List     STOP taking these medications    aspirin EC 81 MG tablet Replaced by: aspirin 81 MG chewable tablet       TAKE these medications    acetaminophen  650 MG CR tablet Commonly known as: TYLENOL  Take 650-1,300 mg by mouth every 8 (eight) hours as needed for pain.   amLODipine  5 MG tablet Commonly known as: NORVASC  Take 5 mg by mouth daily.   Apple Cider Vinegar 500 MG Tabs Take 500 mg by mouth 3 (three) times daily with meals.   ascorbic acid 500 MG tablet Commonly known as: VITAMIN C Take 500 mg by mouth daily.   aspirin 81 MG chewable tablet Chew 1 tablet (81 mg total) by mouth 2 (two) times daily. Replaces: aspirin EC 81 MG tablet   atorvastatin  40 MG tablet Commonly known as: LIPITOR Take 40 mg by mouth every evening.   cholecalciferol 25 MCG (1000 UNIT) tablet Commonly known as: VITAMIN D3 Take 1,000 Units by mouth daily.   Chromium-Cinnamon 100-500 MCG-MG Caps Take 1 capsule by mouth 2 (two) times daily.   glipiZIDE  10 MG 24 hr tablet Commonly known as: GLUCOTROL  XL Take 10 mg by mouth daily.   hydrochlorothiazide  12.5 MG tablet Commonly known as: HYDRODIURIL  Take 12.5 mg by mouth every morning.   Jardiance  25 MG Tabs tablet Generic drug: empagliflozin  Take 25 mg by mouth every morning.   losartan  100 MG tablet Commonly known as: COZAAR  Take 100 mg by mouth every  morning.   Magnesium 200 MG Tabs Take 400 mg by mouth daily.   meloxicam  7.5 MG tablet Commonly known as: MOBIC  TAKE 1 TABLET BY MOUTH EVERY  EVENING *REFILL REQUEST*   metFORMIN  500 MG 24 hr tablet Commonly known as: GLUCOPHAGE -XR Take 1,000 mg by mouth 2 (two) times daily with a meal.   metoprolol  succinate 25 MG 24 hr tablet Commonly known as: TOPROL -XL Take 1 tablet by mouth once daily   multivitamin with minerals Tabs tablet Take 1 tablet by mouth daily.   oxyCODONE  5 MG immediate release tablet Commonly known as: Oxy IR/ROXICODONE  Take 1-2 tablets (5-10 mg total) by mouth every 6 (six) hours as needed for moderate pain (pain score 4-6) (pain score 4-6; 5 mg for pain score 4-5, 10 mg for pain score 6).   oxymetazoline 0.05 % nasal spray Commonly known as: AFRIN Place 1 spray into both nostrils 2 (two) times daily as needed for congestion.   Ozempic (2 MG/DOSE) 8 MG/3ML Sopn Generic drug: Semaglutide (2 MG/DOSE) Inject 2 mg into the skin every Friday.   pioglitazone 15 MG tablet Commonly known as: ACTOS Take 15 mg by mouth daily.   pregabalin  75 MG capsule Commonly known as: LYRICA  TAKE 1 CAPSULE BY MOUTH TWICE A DAY   pregabalin  25 MG capsule Commonly known as: LYRICA  TAKE 1 CAPSULE BY MOUTH 2 TIMES DAILY.   PREVAGEN PO Take 1 capsule by mouth daily.   tamsulosin  0.4 MG Caps capsule Commonly known as: FLOMAX  Take 0.4 mg by mouth daily after supper.   Testosterone 1.62 % Gel Apply 2 Pump topically daily.   THERAWORX MUSCLE CRAMPS SPRAY EX Apply 1 Application topically 2 (two) times daily as needed (leg cramps).   tiZANidine 4 MG tablet Commonly known as: Zanaflex Take 1 tablet (4 mg total) by mouth every 6 (six) hours as needed for muscle spasms.   triamcinolone  ointment 0.5 % Commonly known as: KENALOG  Apply 1 Application topically daily as needed (eczema).   TURMERIC PO Take 1 capsule by mouth in the morning and at bedtime.                Durable Medical Equipment  (From admission, onward)           Start     Ordered   12/28/23 1546  DME 3 n 1  Once        12/28/23 1545   12/28/23 1546  DME Walker rolling  Once       Question Answer Comment  Walker: With 5 Inch Wheels   Patient needs a walker to treat with the following condition Status post total replacement of left hip      12/28/23 1545            Diagnostic Studies: DG Pelvis Portable Result Date: 12/28/2023 EXAM: 1 or 2 VIEW(S) XRAY OF THE PELVIS 12/28/2023 07:14:00 PM COMPARISON: None available. CLINICAL HISTORY: Status post total replacement of left hip. FINDINGS: BONES AND JOINTS: Status post total left hip arthroplasty in expected alignment. No periprosthetic lucency or fracture. SOFT TISSUES: Surgical skin staples noted. Subcutaneous soft tissue edema and emphysema consistent with recent postsurgical changes. Vascular calcifications. IMPRESSION: 1. Status post total left hip arthroplasty without immediate complication. Electronically signed by: Andrea Gasman MD 12/28/2023 08:23 PM EDT RP Workstation: [id number]   DG HIP UNILAT WITH PELVIS 2-3 VIEWS LEFT Result Date: 12/28/2023 CLINICAL DATA:  Elective surgery. EXAM: DG HIP (WITH OR WITHOUT PELVIS) 2-3V LEFT COMPARISON:  None Available. FINDINGS: Six fluoroscopic spot views of the pelvis and left hip obtained in the operating room. Sequential images during hip arthroplasty. Fluoroscopy time 20.2 seconds. Dose 1.75 mGy. IMPRESSION: Intraoperative fluoroscopy  during left hip arthroplasty. Electronically Signed   By: Andrea Gasman M.D.   On: 12/28/2023 12:27   DG C-Arm 1-60 Min-No Report Result Date: 12/28/2023 Fluoroscopy was utilized by the requesting physician.  No radiographic interpretation.    Disposition: Discharge disposition: 01-Home or Self Care          Follow-up Information     [facility] Follow up.   Specialty: Home Health Services Contact  information: [address] [place] [address] [phone]         Vernetta Lonni GRADE, MD Follow up in 2 week(s).   Specialty: Orthopedic Surgery Contact information: [address] [place] [address] [phone]                  Signed: Lonni GRADE Vernetta 12/29/2023, 11:22 AM

## 2023-12-29 NOTE — TOC Progression Note (Signed)
 Transition of Care (TOC) - Progression Note   Patient being discharged today. Lynette with [facility] aware. Patient has DME at home.   No further Inpatient Care Management needs identified  Patient Details  Name: Ronald [name]. MRN: [id number] Date of Birth: 1950-04-16  Transition of Care [facility]) CM/SW Contact  Venus Gilles, [name] Jansky, RN Phone Number: 12/29/2023, 8:41 AM  Clinical Narrative:       Expected Discharge Plan: Home w Home Health Services Barriers to Discharge: Continued Medical Work up               Expected Discharge Plan and Services   Discharge Planning Services: CM Consult Post Acute Care Choice: Home Health, Durable Medical Equipment Living arrangements for the past 2 months: Single Family Home Expected Discharge Date: 12/29/23               DME Arranged: N/A         HH Arranged: PT HH Agency: [facility] Date HH Agency Contacted: 12/28/23 Time HH Agency Contacted: 1614 Representative spoke with at [facility] Agency: Arna   Social Drivers of Health (SDOH) Interventions SDOH Screenings   Food Insecurity: No Food Insecurity (12/28/2023)  Housing: Low Risk  (12/28/2023)  Transportation Needs: No Transportation Needs (12/28/2023)  Utilities: Not At Risk (12/28/2023)  Social Connections: Socially Integrated (12/28/2023)  Tobacco Use: Medium Risk (12/24/2023)    Readmission Risk Interventions     No data to display

## 2023-12-29 NOTE — Anesthesia Postprocedure Evaluation (Signed)
 Anesthesia Post Note  Patient: Ronald [name].  Procedure(s) Performed: ARTHROPLASTY, HIP, TOTAL, ANTERIOR APPROACH (Left: Hip)     Patient location during evaluation: PACU Anesthesia Type: Spinal Level of consciousness: oriented and awake and alert Pain management: pain level controlled Vital Signs Assessment: post-procedure vital signs reviewed and stable Respiratory status: spontaneous breathing, respiratory function stable and patient connected to nasal cannula oxygen Cardiovascular status: blood pressure returned to baseline and stable Postop Assessment: no headache, no backache and no apparent nausea or vomiting Anesthetic complications: no   No notable events documented.              Karlina Suares D Shima Compere

## 2023-12-29 NOTE — Progress Notes (Signed)
 Subjective: 1 Day Post-Op Procedure(s) (LRB): ARTHROPLASTY, HIP, TOTAL, ANTERIOR APPROACH (Left) Patient reports pain as moderate.  Did not get any sleep last evening.  Objective: Vital signs in last 24 hours: Temp:  [97.8 F (36.6 C)-98.7 F (37.1 C)] 98.7 F (37.1 C) (10/08 0451) Pulse Rate:  [72-104] 95 (10/08 0131) Resp:  [12-19] 18 (10/08 0451) BP: (92-124)/(46-72) 124/64 (10/08 0451) SpO2:  [92 %-98 %] 98 % (10/08 0451)  Intake/Output from previous day: 10/07 0701 - 10/08 0700 In: 2516.8 [P.O.:360; I.V.:1506.8; IV Piggyback:650] Out: 2800 [Urine:2550; Blood:250] Intake/Output this shift: Total I/O In: 513.8 [P.O.:360; I.V.:153.8] Out: 1300 [Urine:1300]  Recent Labs    12/29/23 0427  HGB 13.9   Recent Labs    12/29/23 0427  WBC 10.5  RBC 4.58  HCT 41.0  PLT 207   Recent Labs    12/29/23 0427  NA 134*  K 4.2  CL 99  CO2 20*  BUN 22  CREATININE 1.04  GLUCOSE 249*  CALCIUM  8.7*   No results for input(s): LABPT, INR in the last 72 hours.  Sensation intact distally Intact pulses distally Dorsiflexion/Plantar flexion intact Incision: dressing C/D/I   Assessment/Plan: 1 Day Post-Op Procedure(s) (LRB): ARTHROPLASTY, HIP, TOTAL, ANTERIOR APPROACH (Left) Up with therapy Discharge home with home health this afternoon.      [name] 12/29/2023, 6:37 AM

## 2023-12-29 NOTE — Plan of Care (Signed)
  Problem: Education: Goal: Knowledge of General Education information will improve Description: Including pain rating scale, medication(s)/side effects and non-pharmacologic comfort measures Outcome: Progressing   Problem: Health Behavior/Discharge Planning: Goal: Ability to manage health-related needs will improve Outcome: Progressing   Problem: Clinical Measurements: Goal: Ability to maintain clinical measurements within normal limits will improve Outcome: Progressing Goal: Will remain free from infection Outcome: Progressing Goal: Diagnostic test results will improve Outcome: Progressing Goal: Respiratory complications will improve Outcome: Progressing Goal: Cardiovascular complication will be avoided Outcome: Progressing   Problem: Activity: Goal: Risk for activity intolerance will decrease Outcome: Progressing   Problem: Nutrition: Goal: Adequate nutrition will be maintained Outcome: Progressing   Problem: Coping: Goal: Level of anxiety will decrease Outcome: Progressing   Problem: Elimination: Goal: Will not experience complications related to bowel motility Outcome: Progressing Goal: Will not experience complications related to urinary retention Outcome: Progressing   Problem: Pain Managment: Goal: General experience of comfort will improve and/or be controlled Outcome: Progressing   Problem: Safety: Goal: Ability to remain free from injury will improve Outcome: Progressing   Problem: Skin Integrity: Goal: Risk for impaired skin integrity will decrease Outcome: Progressing   Problem: Education: Goal: Knowledge of the prescribed therapeutic regimen will improve Outcome: Progressing Goal: Understanding of discharge needs will improve Outcome: Progressing Goal: Individualized Educational Video(s) Outcome: Progressing   Problem: Activity: Goal: Ability to avoid complications of mobility impairment will improve Outcome: Progressing Goal: Ability to  tolerate increased activity will improve Outcome: Progressing   Problem: Clinical Measurements: Goal: Postoperative complications will be avoided or minimized Outcome: Progressing   Problem: Pain Management: Goal: Pain level will decrease with appropriate interventions Outcome: Progressing   Problem: Skin Integrity: Goal: Will show signs of wound healing Outcome: Progressing   Problem: Education: Goal: Ability to describe self-care measures that may prevent or decrease complications (Diabetes Survival Skills Education) will improve Outcome: Progressing Goal: Individualized Educational Video(s) Outcome: Progressing   Problem: Coping: Goal: Ability to adjust to condition or change in health will improve Outcome: Progressing   Problem: Fluid Volume: Goal: Ability to maintain a balanced intake and output will improve Outcome: Progressing   Problem: Health Behavior/Discharge Planning: Goal: Ability to identify and utilize available resources and services will improve Outcome: Progressing Goal: Ability to manage health-related needs will improve Outcome: Progressing   Problem: Metabolic: Goal: Ability to maintain appropriate glucose levels will improve Outcome: Progressing   Problem: Nutritional: Goal: Maintenance of adequate nutrition will improve Outcome: Progressing Goal: Progress toward achieving an optimal weight will improve Outcome: Progressing   Problem: Skin Integrity: Goal: Risk for impaired skin integrity will decrease Outcome: Progressing   Problem: Tissue Perfusion: Goal: Adequacy of tissue perfusion will improve Outcome: Progressing

## 2024-01-05 ENCOUNTER — Telehealth: Payer: Self-pay

## 2024-01-05 NOTE — Telephone Encounter (Signed)
 [place], PT with [facility] called stating that patient has a knot on his left hip incision.  Stated that it's hard to touch and a little painful.  CB# for Medford is [phone] if needed.  CB# for patient is  [phone]   Left hip surgery on 12/28/2023.  Please advise.  Thank you.

## 2024-01-06 NOTE — Telephone Encounter (Signed)
LMOM for PT 

## 2024-01-10 ENCOUNTER — Ambulatory Visit (INDEPENDENT_AMBULATORY_CARE_PROVIDER_SITE_OTHER): Admitting: Orthopaedic Surgery

## 2024-01-10 ENCOUNTER — Encounter: Payer: Self-pay | Admitting: Orthopaedic Surgery

## 2024-01-10 ENCOUNTER — Telehealth: Payer: Self-pay | Admitting: Orthopaedic Surgery

## 2024-01-10 DIAGNOSIS — Z96642 Presence of left artificial hip joint: Secondary | ICD-10-CM

## 2024-01-10 MED ORDER — OXYCODONE HCL 5 MG PO TABS: 5.0000 mg | ORAL_TABLET | Freq: Four times a day (QID) | ORAL | 0 refills | Status: DC | PRN

## 2024-01-10 MED ORDER — TIZANIDINE HCL 4 MG PO TABS: 4.0000 mg | ORAL_TABLET | Freq: Four times a day (QID) | ORAL | 0 refills | Status: DC | PRN

## 2024-01-10 NOTE — Telephone Encounter (Signed)
Diagnosis given

## 2024-01-10 NOTE — Telephone Encounter (Signed)
 Pt's wife called stating that the pharmacy can't fill his Oxycodone  without the diagnosis code. Pharmacy is [facility] in [place]. Pt wife  would like a call back when prescription is called in. Pt  wife callback number is [phone]

## 2024-01-10 NOTE — Progress Notes (Signed)
 The patient is here today for his first postoperative visit status post a left total hip arthroplasty to treat significant left hip pain and arthritis.  He is an active 73 year old gentleman.  He has been compliant with a baby aspirin twice daily.  On exam he does have a large seroma at his left hip and I did aspirate about 50 cc of fluid from this area to decompress it.  There is no evidence of infection.  The incision looks good.  Staples were removed and Steri-Strips applied.  His calf is soft.  He is only just walking with a cane.  He will slowly increase his activities as comfort allows.  Will see him back in 4 weeks to see how he is doing overall.  He did ask for refill of pain medication and muscle relaxant.  He also says that he will call us  back soon to let us  know about a earlier return to work note because he does not have much time out from work.  Once he lets us  know we can give him a note and send it in stating that he can return once he feels like he is ready.

## 2024-01-20 ENCOUNTER — Other Ambulatory Visit: Payer: Self-pay | Admitting: Orthopaedic Surgery

## 2024-01-20 ENCOUNTER — Telehealth: Payer: Self-pay | Admitting: Orthopaedic Surgery

## 2024-01-20 MED ORDER — OXYCODONE HCL 5 MG PO TABS: 5.0000 mg | ORAL_TABLET | Freq: Four times a day (QID) | ORAL | 0 refills | Status: AC | PRN

## 2024-01-20 MED ORDER — TIZANIDINE HCL 4 MG PO TABS: 4.0000 mg | ORAL_TABLET | Freq: Four times a day (QID) | ORAL | 0 refills | Status: AC | PRN

## 2024-01-20 NOTE — Telephone Encounter (Signed)
 Patient's wife called. She says patient needs a refill on oxycodone  and muscle relaxer.

## 2024-01-24 ENCOUNTER — Other Ambulatory Visit: Payer: Self-pay | Admitting: Orthopedic Surgery

## 2024-01-24 ENCOUNTER — Encounter: Payer: Self-pay | Admitting: Radiology

## 2024-01-24 ENCOUNTER — Other Ambulatory Visit: Payer: Self-pay | Admitting: Orthopaedic Surgery

## 2024-01-27 ENCOUNTER — Encounter: Admitting: Orthopaedic Surgery

## 2024-02-02 ENCOUNTER — Telehealth: Payer: Self-pay | Admitting: Emergency Medicine

## 2024-02-02 DIAGNOSIS — R011 Cardiac murmur, unspecified: Secondary | ICD-10-CM

## 2024-02-02 MED ORDER — METOPROLOL SUCCINATE ER 25 MG PO TB24: 25.0000 mg | ORAL_TABLET | Freq: Every day | ORAL | 3 refills | Status: AC

## 2024-02-02 NOTE — Telephone Encounter (Signed)
 Refill sent.

## 2024-02-02 NOTE — Telephone Encounter (Signed)
*  STAT* If patient is at the pharmacy, call can be transferred to refill team.   1. Which medications need to be refilled? (please list name of each medication and dose if known) metoprolol  succinate (TOPROL -XL) 25 MG 24 hr tablet    2. Would you like to learn more about the convenience, safety, & potential cost savings by using the [facility]? no   3. Are you open to using the [facility] (Type [facility]. ). no   4. Which pharmacy/location (including street and city if local pharmacy) is medication to be sent [address] [place], [place] [address]    5. Do they need a 30 day or 90 day supply? 90 day    Pt is out of medication and needs new RX

## 2024-02-09 ENCOUNTER — Encounter: Payer: Self-pay | Admitting: Orthopaedic Surgery

## 2024-02-09 ENCOUNTER — Ambulatory Visit (INDEPENDENT_AMBULATORY_CARE_PROVIDER_SITE_OTHER): Admitting: Orthopaedic Surgery

## 2024-02-09 DIAGNOSIS — Z96642 Presence of left artificial hip joint: Secondary | ICD-10-CM

## 2024-02-09 NOTE — Progress Notes (Signed)
 The patient is here today at 6 weeks status post a left total hip replacement.  He says the left hip is doing well.  He does not walk with any assistive device.  He is 73 years old.  He has also had spine surgery by my partner Dr. Georgina.  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  He feels like he can get back to work starting Monday, November 24 and would like a note for that today.  On exam he still has some stiffness with rotation of the left hip and he does report that he is starting to get better flexion in terms of putting his shoes and socks on but he still has a hard time on tying his shoes on the left side.  He still reports some stiffness in his hip and his back when he first gets up from sitting or goes to sit back down and even some stiffness in the morning but he is slowly improving overall.  From my standpoint the next time when he is seen is not for 6 months unless he is having issues.  Will have a standing AP pelvis and lateral of his left hip at that visit.  If there are issues before then he will let us  know.  I did give him a return to work note.

## 2024-03-01 ENCOUNTER — Other Ambulatory Visit (INDEPENDENT_AMBULATORY_CARE_PROVIDER_SITE_OTHER)

## 2024-03-01 ENCOUNTER — Ambulatory Visit (INDEPENDENT_AMBULATORY_CARE_PROVIDER_SITE_OTHER): Admitting: Orthopedic Surgery

## 2024-03-01 DIAGNOSIS — M5416 Radiculopathy, lumbar region: Secondary | ICD-10-CM

## 2024-03-01 NOTE — Progress Notes (Signed)
 Orthopedic Surgery Post-operative Office Visit   Procedure: L4-S1 laminectomies and foraminotomies Date of Surgery: 11/02/2022 (1.25 years post-op)   Assessment: Patient is a 73 y.o. who still having low back and bilateral lateral thigh pain.  His back pain is much more significant than his leg pain.  Leg pain has improved since surgery but has not resolved     Plan: -Patient has tried tylenol , lyrica , tramadol , lumbar steroid injection -Discussed his treatment options going forward.  He has tried multiple medications.  I briefly discussed therapy as an option but motion and activity increases pain symmetric it is the best option.  I explained that that would be a requirement of his insurance company prior to any surgical intervention though.  I told him that his 3 best remaining options would probably be a two-level fusion, pain management, or a spinal cord stimulator.  After discussing these options, patient wanted to proceed with pain management.  Referral provided to him today -Follow-up on as-needed basis   ___________________________________________________________________________     Subjective: Patient continues to have significant low back pain.  He notes it particularly at the end of the day.  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  He feels it in the lower lumbar region.  He also has pain radiating into his bilateral lateral thighs.  The left side is more significant than the right.  He is not interested in any kind of lumbar fusion surgery at this point.  His wife had a fusion type surgery and has had adjacent segment disease and had to go undergo a secondary procedure.  No bowel or bladder continence.  No saddle anesthesia.     Objective:   General: no acute distress, appropriate affect Neurologic: alert, answering questions appropriately, following commands Respiratory: unlabored breathing on room air Skin: incision is  well-healed   MSK (spine):   -Strength exam                                                   Left                  Right   EHL                              5/5                  5/5 TA                                 5/5                  5/5 GSC                             5/5                  5/5 Knee extension            5/5                  5/5 Hip flexion  5/5                  5/5   -Sensory exam                           Sensation intact to light touch in L3-S1 nerve distributions of bilateral lower extremities    -Achilles DTR: 2/4 on the left, 2/4 on the right -Patellar tendon DTR: 2/4 on the left, 2/4 on the right  - Ambulates without assistive devices - No beats of clonus bilaterally -Negative SLR bilaterally     Imaging: XRs of the lumbar spine from 03/01/2024 were independently reviewed and interpreted, showing disc height loss at L4/5 and L5/S1.  Laminectomy defect from L4-S1 seen.  No fracture or dislocation seen.  No evidence of instability on the flexion/extension views.     Patient name: Ronald [name]. Patient MRN: [id number] Date of visit: 03/01/24   I spent 20 minutes in the room with the patient going over his history, exam, and treatment options.  We spent significant time covering the risk, benefits, alternatives of the 3 main options that I presented to him.  He asked several questions and I spent time answering those questions after our conversation, as stated above, he wanted proceed with pain management.  I explained that he can change his mind at any point we could proceed with either the stimulator or type surgery.  I did tell him know that his insurance would require 6 weeks of PT prior to any kind of surgical intervention.

## 2024-03-13 ENCOUNTER — Other Ambulatory Visit: Payer: Self-pay | Admitting: Orthopedic Surgery

## 2024-03-30 ENCOUNTER — Other Ambulatory Visit: Payer: Self-pay | Admitting: Orthopedic Surgery

## 2024-04-10 ENCOUNTER — Encounter: Payer: Self-pay | Admitting: Physical Medicine & Rehabilitation

## 2024-04-18 NOTE — Progress Notes (Unsigned)
 "  Ronald [name]. - 74 y.o. male MRN [id number]  Date of birth: [birth year]  Office Visit Note: Visit Date: 04/19/2024 PCP: Rolinda Millman, MD Referred by: Rolinda Millman, MD  Subjective: No chief complaint on file.  HPI: Ronald [name]. is a pleasant 75 y.o. male who presents today for evaluation of right hand long finger pain and swelling at the MCP region is present now for roughly 1 month.  Denies any recent injury, has noted ongoing stiffness as well in the right hand with associated pain with heavy grip.  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  He is left-hand dominant.  Has not trialed any significant treatment modalities yet.  Pertinent ROS were reviewed with the patient and found to be negative unless otherwise specified above in HPI.   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  This correlates with his clinical examination with notable tenderness and swelling at this region.  We discussed the underlying arthritic nature of the joint as well as etiology and pathophysiology.  We discussed treatment modalities ranging from conservative to surgical.  From a conservative standpoint, we discussed nonsteroidal anti-inflammatories both topical and oral, activity modification, therapeutic exercise and potential cortisone injections.  From a surgical standpoint, I discussed with him the possibility of MCP arthroplasty versus arthrodesis in the future should symptoms remain  refractory to conservative care.  At this juncture, given that he has not trialed any significant treat modalities, he would like to begin with nonsteroidal anti-inflammatory medication which is appropriate.  Have recommended that he trial Voltaren gel in this region.  He can follow-up in approximate 6 weeks for a follow-up at that juncture if he is not responding well to the topical or oral nonsteroidal anti-inflammatories, we can consider potential cortisone injection to the right long finger MCP.  He expressed full understanding.  I spent 30 minutes in the care of this patient today including review of previous documentation, imaging obtained, face-to-face time discussing all options regarding treatment and documenting the encounter.   Follow-up: No follow-ups on file.   Meds & Orders: No orders of the defined types were placed in this encounter.   Orders Placed This Encounter  Procedures   XR Hand Complete Right     Procedures: No procedures performed      Clinical History: CLINICAL DATA:  Low back pain, symptoms persist with > 6 wks treatment   EXAM: MRI LUMBAR SPINE WITHOUT CONTRAST   TECHNIQUE: Multiplanar, multisequence MR imaging of the lumbar spine was performed. No intravenous contrast was administered.   COMPARISON:  None Available.   FINDINGS: Segmentation:  Standard.   Alignment:  Normal.   Vertebrae: Right eccentric degenerative/discogenic endplate signal changes at L5-S1. No specific evidence of acute fracture discitis/osteomyelitis. Heterogeneous marrow without suspicious lesion.   Conus medullaris and cauda equina: Conus extends to the L1-L2 level. Conus appears normal.   Paraspinal and other soft tissues: Unremarkable.   Disc levels:   T12-L1: No significant disc protrusion,  foraminal stenosis, or canal stenosis.   L1-L2: No significant disc protrusion, foraminal stenosis, or canal stenosis.   L2-L3: Disc bulging.  Facet arthropathy.  No  significant stenosis.   L3-L4: Disc bulging. Facet arthropathy. Mild right foraminal stenosis with right far lateral/extraforaminal disc closely approximating the exiting/exited right L3 nerve.   L4-L5: Broad disc bulge. Bilateral facet arthropathy. Moderate left foraminal stenosis with left far lateral/extraforaminal disc closely approximating the exiting left L4 nerve. Ligamentum flavum thickening. Mild canal stenosis.   L5-S1: Disc bulging and bilateral facet arthropathy. Moderate bilateral foraminal stenosis. Patent canal.   IMPRESSION: 1. At L5-S1, moderate bilateral foraminal stenosis. 2. At L4-L5, moderate left foraminal stenosis with left far lateral/extraforaminal disc closely approximating the exiting left L4 nerve. Mild canal stenosis. 3. At L3-L4, mild right foraminal stenosis with right far lateral/extraforaminal disc closely approximating the exiting/exited right L3 nerve.     Electronically Signed   By: Gilmore GORMAN Molt M.D.   On: 05/19/2022 08:50  He reports that he quit smoking about 31 years ago. His smoking use included cigarettes. He started smoking about 49 years ago. He has a 36 pack-year smoking history. He has never used smokeless tobacco.  Recent Labs    12/24/23 1500  HGBA1C 7.0*    Objective:   Vital Signs: There were no vitals taken for this visit.  Physical Exam  Gen: Well-appearing, in no acute distress; non-toxic CV: Regular Rate. Well-perfused. Warm.  Resp: Breathing unlabored on room air; no wheezing. Psych: Fluid speech in conversation; appropriate affect; normal thought process  Ortho Exam Right hand: - Notable swelling over the MCP of the long finger, tenderness elicited with deep palpation of the MCP as well - Near composite fist without restriction, improved passively - Sensation intact distally, hand remains warm well-perfused   Imaging: XR Hand Complete Right Result Date: 04/19/2024 X-rays of the right hand demonstrate  notable degenerative changes at the long finger MCP region with asymmetrical joint space narrowing.   Past Medical/Family/Surgical/Social History: Medications & Allergies reviewed per EMR, new medications updated. 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   Peroneal tendinitis, right leg 02/19/2019   Trigger thumb, left thumb 06/10/2017   Chronic left shoulder pain 10/08/2016   Neck pain 10/08/2016   Past Medical History:  Diagnosis Date   Arthritis    Left Hip   Carpal tunnel syndrome    Diabetes mellitus without complication (HCC)    Diabetic peripheral neuropathy (HCC)    Diabetic renal disease (HCC)    Elevated hemoglobin    Esophageal polyp    GERD (gastroesophageal reflux disease)    Heart murmur    mild AS 07/16/22   HLD (hyperlipidemia)    Hyperglycemia due to type 2 diabetes mellitus (HCC)    Hyperplasia of prostate    Hypertension    Hypertensive retinopathy    Hypogonadism in male    Kidney disease, chronic, stage III (GFR 30-59 ml/min) (HCC)    Rotator cuff tear    Skin cancer    removed from left arm   Thrombocytosis  Trigger finger    No family history on file. Past Surgical History:  Procedure Laterality Date   CARPAL BOSS EXCISION     CARPAL TUNNEL RELEASE Bilateral    CATARACT EXTRACTION W/ INTRAOCULAR LENS IMPLANT Bilateral    COLONOSCOPY  2022   DECOMPRESSIVE LUMBAR LAMINECTOMY LEVEL 1 N/A 11/02/2022   Procedure: L4-5, L5-S1 LAMINECTOMY AND FORAMINOTOMIES;  Surgeon: Georgina Ozell LABOR, MD;   Location: [facility] OR;  Service: Orthopedics;  Laterality: N/A;   KNEE SURGERY Bilateral    arthroscopic   PARATHYROIDECTOMY Right    right lower removed   ROTATOR CUFF REPAIR Right    ROTATOR CUFF REPAIR Left    and biceps repair   TOTAL HIP ARTHROPLASTY Left 12/28/2023   Procedure: ARTHROPLASTY, HIP, TOTAL, ANTERIOR APPROACH;  Surgeon: Vernetta Lonni GRADE, MD;  Location: [facility] OR;  Service: Orthopedics;  Laterality: Left;   Social History   Occupational History   Not on file  Tobacco Use   Smoking status: Former    Current packs/day: 0.00    Average packs/day: 2.0 packs/day for 18.0 years (36.0 ttl pk-yrs)    Types: Cigarettes    Start date: 64    Quit date: 32    Years since quitting: 31.0   Smokeless tobacco: Never  Vaping Use   Vaping status: Never Used  Substance and Sexual Activity   Alcohol use: No   Drug use: No   Sexual activity: Yes    Toleen Lachapelle Afton Alderton, M.D. [facility], Hand Surgery  "

## 2024-04-19 ENCOUNTER — Ambulatory Visit: Admitting: Orthopedic Surgery

## 2024-04-19 ENCOUNTER — Other Ambulatory Visit (INDEPENDENT_AMBULATORY_CARE_PROVIDER_SITE_OTHER): Payer: Self-pay

## 2024-04-19 DIAGNOSIS — M79641 Pain in right hand: Secondary | ICD-10-CM | POA: Diagnosis not present

## 2024-05-09 ENCOUNTER — Encounter: Admitting: Physical Medicine & Rehabilitation

## 2024-05-29 ENCOUNTER — Ambulatory Visit: Admitting: Orthopedic Surgery
